# Patient Record
Sex: Female | Born: 1940
Health system: Southern US, Community
[De-identification: ages and names within clinical notes are randomized; demographics above are authoritative.]

## PROBLEM LIST (undated history)

## (undated) DIAGNOSIS — E559 Vitamin D deficiency, unspecified: Secondary | ICD-10-CM

## (undated) DIAGNOSIS — R32 Unspecified urinary incontinence: Secondary | ICD-10-CM

## (undated) DIAGNOSIS — Z8719 Personal history of other diseases of the digestive system: Secondary | ICD-10-CM

## (undated) DIAGNOSIS — G2581 Restless legs syndrome: Secondary | ICD-10-CM

## (undated) DIAGNOSIS — M1710 Unilateral primary osteoarthritis, unspecified knee: Secondary | ICD-10-CM

## (undated) DIAGNOSIS — Z87898 Personal history of other specified conditions: Secondary | ICD-10-CM

## (undated) DIAGNOSIS — M179 Osteoarthritis of knee, unspecified: Secondary | ICD-10-CM

## (undated) DIAGNOSIS — E785 Hyperlipidemia, unspecified: Secondary | ICD-10-CM

## (undated) DIAGNOSIS — D509 Iron deficiency anemia, unspecified: Secondary | ICD-10-CM

## (undated) DIAGNOSIS — F419 Anxiety disorder, unspecified: Secondary | ICD-10-CM

## (undated) DIAGNOSIS — Z8709 Personal history of other diseases of the respiratory system: Secondary | ICD-10-CM

## (undated) DIAGNOSIS — N189 Chronic kidney disease, unspecified: Secondary | ICD-10-CM

## (undated) DIAGNOSIS — K219 Gastro-esophageal reflux disease without esophagitis: Secondary | ICD-10-CM

## (undated) DIAGNOSIS — E039 Hypothyroidism, unspecified: Secondary | ICD-10-CM

## (undated) DIAGNOSIS — R569 Unspecified convulsions: Secondary | ICD-10-CM

## (undated) DIAGNOSIS — I10 Essential (primary) hypertension: Secondary | ICD-10-CM

## (undated) HISTORY — DX: Chronic kidney disease, unspecified: N18.9

## (undated) HISTORY — PX: TONSILLECTOMY: SUR1361

## (undated) HISTORY — PX: COLONOSCOPY: SHX174

## (undated) HISTORY — PX: TOTAL KNEE ARTHROPLASTY: SHX125

## (undated) HISTORY — PX: BREAST SURGERY: SHX581

## (undated) HISTORY — PX: ABDOMINAL HYSTERECTOMY: SHX81

## (undated) HISTORY — PX: JOINT REPLACEMENT: SHX530

## (undated) HISTORY — PX: KNEE SURGERY: SHX244

## (undated) HISTORY — PX: BREAST BIOPSY: SHX20

---

## 1998-05-23 ENCOUNTER — Other Ambulatory Visit: Admission: RE | Admit: 1998-05-23 | Discharge: 1998-05-23 | Payer: Self-pay | Admitting: Obstetrics and Gynecology

## 1999-05-25 ENCOUNTER — Other Ambulatory Visit: Admission: RE | Admit: 1999-05-25 | Discharge: 1999-05-25 | Payer: Self-pay | Admitting: Obstetrics and Gynecology

## 2000-09-13 ENCOUNTER — Other Ambulatory Visit: Admission: RE | Admit: 2000-09-13 | Discharge: 2000-09-13 | Payer: Self-pay | Admitting: Gynecology

## 2015-10-20 DIAGNOSIS — E785 Hyperlipidemia, unspecified: Secondary | ICD-10-CM | POA: Insufficient documentation

## 2015-10-20 DIAGNOSIS — R002 Palpitations: Secondary | ICD-10-CM | POA: Insufficient documentation

## 2015-10-20 DIAGNOSIS — E039 Hypothyroidism, unspecified: Secondary | ICD-10-CM | POA: Insufficient documentation

## 2015-10-20 DIAGNOSIS — E559 Vitamin D deficiency, unspecified: Secondary | ICD-10-CM | POA: Insufficient documentation

## 2015-10-20 DIAGNOSIS — R12 Heartburn: Secondary | ICD-10-CM | POA: Insufficient documentation

## 2015-10-20 DIAGNOSIS — I10 Essential (primary) hypertension: Secondary | ICD-10-CM | POA: Diagnosis not present

## 2015-10-20 DIAGNOSIS — R413 Other amnesia: Secondary | ICD-10-CM | POA: Insufficient documentation

## 2015-10-20 DIAGNOSIS — M1712 Unilateral primary osteoarthritis, left knee: Secondary | ICD-10-CM | POA: Insufficient documentation

## 2015-10-22 DIAGNOSIS — I10 Essential (primary) hypertension: Secondary | ICD-10-CM | POA: Diagnosis not present

## 2015-10-22 DIAGNOSIS — E785 Hyperlipidemia, unspecified: Secondary | ICD-10-CM | POA: Diagnosis not present

## 2015-11-25 DIAGNOSIS — H2513 Age-related nuclear cataract, bilateral: Secondary | ICD-10-CM | POA: Diagnosis not present

## 2015-11-25 DIAGNOSIS — H02834 Dermatochalasis of left upper eyelid: Secondary | ICD-10-CM | POA: Diagnosis not present

## 2015-11-25 DIAGNOSIS — H02831 Dermatochalasis of right upper eyelid: Secondary | ICD-10-CM | POA: Diagnosis not present

## 2015-11-25 DIAGNOSIS — H539 Unspecified visual disturbance: Secondary | ICD-10-CM | POA: Diagnosis not present

## 2016-02-27 DIAGNOSIS — M5432 Sciatica, left side: Secondary | ICD-10-CM | POA: Diagnosis not present

## 2016-02-27 DIAGNOSIS — G8929 Other chronic pain: Secondary | ICD-10-CM | POA: Diagnosis not present

## 2016-02-27 DIAGNOSIS — M5440 Lumbago with sciatica, unspecified side: Secondary | ICD-10-CM | POA: Diagnosis not present

## 2016-02-27 DIAGNOSIS — M25561 Pain in right knee: Secondary | ICD-10-CM | POA: Diagnosis not present

## 2016-02-27 DIAGNOSIS — M533 Sacrococcygeal disorders, not elsewhere classified: Secondary | ICD-10-CM | POA: Diagnosis not present

## 2016-04-20 DIAGNOSIS — I10 Essential (primary) hypertension: Secondary | ICD-10-CM | POA: Diagnosis not present

## 2016-04-20 DIAGNOSIS — R413 Other amnesia: Secondary | ICD-10-CM | POA: Diagnosis not present

## 2016-04-20 DIAGNOSIS — E039 Hypothyroidism, unspecified: Secondary | ICD-10-CM | POA: Diagnosis not present

## 2016-05-11 DIAGNOSIS — I10 Essential (primary) hypertension: Secondary | ICD-10-CM | POA: Diagnosis not present

## 2016-05-11 DIAGNOSIS — E039 Hypothyroidism, unspecified: Secondary | ICD-10-CM | POA: Diagnosis not present

## 2016-05-12 DIAGNOSIS — Z0000 Encounter for general adult medical examination without abnormal findings: Secondary | ICD-10-CM | POA: Diagnosis not present

## 2016-05-12 DIAGNOSIS — Z136 Encounter for screening for cardiovascular disorders: Secondary | ICD-10-CM | POA: Diagnosis not present

## 2016-05-12 DIAGNOSIS — I499 Cardiac arrhythmia, unspecified: Secondary | ICD-10-CM | POA: Diagnosis not present

## 2016-05-12 DIAGNOSIS — Z23 Encounter for immunization: Secondary | ICD-10-CM | POA: Diagnosis not present

## 2016-05-14 DIAGNOSIS — G40909 Epilepsy, unspecified, not intractable, without status epilepticus: Secondary | ICD-10-CM | POA: Diagnosis not present

## 2016-05-21 DIAGNOSIS — G40909 Epilepsy, unspecified, not intractable, without status epilepticus: Secondary | ICD-10-CM | POA: Diagnosis not present

## 2016-05-25 DIAGNOSIS — I10 Essential (primary) hypertension: Secondary | ICD-10-CM | POA: Diagnosis not present

## 2016-05-26 DIAGNOSIS — I10 Essential (primary) hypertension: Secondary | ICD-10-CM | POA: Diagnosis not present

## 2016-05-26 DIAGNOSIS — G40909 Epilepsy, unspecified, not intractable, without status epilepticus: Secondary | ICD-10-CM | POA: Diagnosis not present

## 2016-05-26 DIAGNOSIS — E875 Hyperkalemia: Secondary | ICD-10-CM | POA: Diagnosis not present

## 2016-05-27 DIAGNOSIS — Z23 Encounter for immunization: Secondary | ICD-10-CM | POA: Diagnosis not present

## 2016-06-01 DIAGNOSIS — Z1212 Encounter for screening for malignant neoplasm of rectum: Secondary | ICD-10-CM | POA: Diagnosis not present

## 2016-06-08 DIAGNOSIS — G40909 Epilepsy, unspecified, not intractable, without status epilepticus: Secondary | ICD-10-CM | POA: Diagnosis not present

## 2016-06-09 DIAGNOSIS — R195 Other fecal abnormalities: Secondary | ICD-10-CM | POA: Diagnosis not present

## 2016-06-11 DIAGNOSIS — Z0000 Encounter for general adult medical examination without abnormal findings: Secondary | ICD-10-CM | POA: Diagnosis not present

## 2016-06-16 DIAGNOSIS — G40909 Epilepsy, unspecified, not intractable, without status epilepticus: Secondary | ICD-10-CM | POA: Diagnosis not present

## 2016-06-29 DIAGNOSIS — G40909 Epilepsy, unspecified, not intractable, without status epilepticus: Secondary | ICD-10-CM | POA: Diagnosis not present

## 2016-07-12 DIAGNOSIS — Z780 Asymptomatic menopausal state: Secondary | ICD-10-CM | POA: Diagnosis not present

## 2016-07-12 DIAGNOSIS — Z1231 Encounter for screening mammogram for malignant neoplasm of breast: Secondary | ICD-10-CM | POA: Diagnosis not present

## 2016-07-12 DIAGNOSIS — M8588 Other specified disorders of bone density and structure, other site: Secondary | ICD-10-CM | POA: Diagnosis not present

## 2016-08-05 DIAGNOSIS — J0140 Acute pansinusitis, unspecified: Secondary | ICD-10-CM | POA: Diagnosis not present

## 2016-08-05 DIAGNOSIS — R030 Elevated blood-pressure reading, without diagnosis of hypertension: Secondary | ICD-10-CM | POA: Diagnosis not present

## 2016-08-10 DIAGNOSIS — G40909 Epilepsy, unspecified, not intractable, without status epilepticus: Secondary | ICD-10-CM | POA: Diagnosis not present

## 2016-08-24 DIAGNOSIS — I10 Essential (primary) hypertension: Secondary | ICD-10-CM | POA: Diagnosis not present

## 2016-08-25 DIAGNOSIS — E039 Hypothyroidism, unspecified: Secondary | ICD-10-CM | POA: Diagnosis not present

## 2016-08-25 DIAGNOSIS — E785 Hyperlipidemia, unspecified: Secondary | ICD-10-CM | POA: Diagnosis not present

## 2016-08-25 DIAGNOSIS — I10 Essential (primary) hypertension: Secondary | ICD-10-CM | POA: Diagnosis not present

## 2016-08-25 DIAGNOSIS — G2581 Restless legs syndrome: Secondary | ICD-10-CM | POA: Insufficient documentation

## 2016-11-05 DIAGNOSIS — K449 Diaphragmatic hernia without obstruction or gangrene: Secondary | ICD-10-CM | POA: Diagnosis not present

## 2016-11-05 DIAGNOSIS — J209 Acute bronchitis, unspecified: Secondary | ICD-10-CM | POA: Diagnosis not present

## 2016-11-05 DIAGNOSIS — R509 Fever, unspecified: Secondary | ICD-10-CM | POA: Diagnosis not present

## 2016-11-23 DIAGNOSIS — I10 Essential (primary) hypertension: Secondary | ICD-10-CM | POA: Diagnosis not present

## 2016-11-24 DIAGNOSIS — E039 Hypothyroidism, unspecified: Secondary | ICD-10-CM | POA: Diagnosis not present

## 2016-11-24 DIAGNOSIS — G2581 Restless legs syndrome: Secondary | ICD-10-CM | POA: Diagnosis not present

## 2016-11-24 DIAGNOSIS — M25561 Pain in right knee: Secondary | ICD-10-CM | POA: Diagnosis not present

## 2016-11-24 DIAGNOSIS — E785 Hyperlipidemia, unspecified: Secondary | ICD-10-CM | POA: Diagnosis not present

## 2016-11-24 DIAGNOSIS — E669 Obesity, unspecified: Secondary | ICD-10-CM | POA: Diagnosis not present

## 2016-11-24 DIAGNOSIS — I10 Essential (primary) hypertension: Secondary | ICD-10-CM | POA: Diagnosis not present

## 2016-11-24 DIAGNOSIS — Z6835 Body mass index (BMI) 35.0-35.9, adult: Secondary | ICD-10-CM | POA: Diagnosis not present

## 2017-03-22 DIAGNOSIS — G40909 Epilepsy, unspecified, not intractable, without status epilepticus: Secondary | ICD-10-CM | POA: Diagnosis not present

## 2017-04-12 DIAGNOSIS — R609 Edema, unspecified: Secondary | ICD-10-CM | POA: Diagnosis not present

## 2017-04-12 DIAGNOSIS — I10 Essential (primary) hypertension: Secondary | ICD-10-CM | POA: Diagnosis not present

## 2017-04-13 DIAGNOSIS — Z79899 Other long term (current) drug therapy: Secondary | ICD-10-CM | POA: Diagnosis not present

## 2017-04-13 DIAGNOSIS — R600 Localized edema: Secondary | ICD-10-CM | POA: Diagnosis not present

## 2017-04-13 DIAGNOSIS — D649 Anemia, unspecified: Secondary | ICD-10-CM | POA: Diagnosis not present

## 2017-04-14 DIAGNOSIS — D649 Anemia, unspecified: Secondary | ICD-10-CM | POA: Diagnosis not present

## 2017-05-03 DIAGNOSIS — Z23 Encounter for immunization: Secondary | ICD-10-CM | POA: Diagnosis not present

## 2017-06-16 DIAGNOSIS — R600 Localized edema: Secondary | ICD-10-CM | POA: Diagnosis not present

## 2017-06-16 DIAGNOSIS — E782 Mixed hyperlipidemia: Secondary | ICD-10-CM | POA: Diagnosis not present

## 2017-06-16 DIAGNOSIS — G2581 Restless legs syndrome: Secondary | ICD-10-CM | POA: Diagnosis not present

## 2017-06-16 DIAGNOSIS — K219 Gastro-esophageal reflux disease without esophagitis: Secondary | ICD-10-CM | POA: Diagnosis not present

## 2017-06-16 DIAGNOSIS — G40909 Epilepsy, unspecified, not intractable, without status epilepticus: Secondary | ICD-10-CM | POA: Diagnosis not present

## 2017-06-16 DIAGNOSIS — I10 Essential (primary) hypertension: Secondary | ICD-10-CM | POA: Diagnosis not present

## 2017-06-16 DIAGNOSIS — M170 Bilateral primary osteoarthritis of knee: Secondary | ICD-10-CM | POA: Diagnosis not present

## 2017-06-16 DIAGNOSIS — D509 Iron deficiency anemia, unspecified: Secondary | ICD-10-CM | POA: Diagnosis not present

## 2017-06-16 DIAGNOSIS — E039 Hypothyroidism, unspecified: Secondary | ICD-10-CM | POA: Diagnosis not present

## 2017-08-09 DIAGNOSIS — H40013 Open angle with borderline findings, low risk, bilateral: Secondary | ICD-10-CM | POA: Diagnosis not present

## 2017-08-09 DIAGNOSIS — H2513 Age-related nuclear cataract, bilateral: Secondary | ICD-10-CM | POA: Diagnosis not present

## 2017-09-16 DIAGNOSIS — K219 Gastro-esophageal reflux disease without esophagitis: Secondary | ICD-10-CM | POA: Diagnosis not present

## 2017-09-16 DIAGNOSIS — I10 Essential (primary) hypertension: Secondary | ICD-10-CM | POA: Diagnosis not present

## 2017-09-16 DIAGNOSIS — E782 Mixed hyperlipidemia: Secondary | ICD-10-CM | POA: Diagnosis not present

## 2017-09-16 DIAGNOSIS — N183 Chronic kidney disease, stage 3 (moderate): Secondary | ICD-10-CM | POA: Diagnosis not present

## 2017-09-16 DIAGNOSIS — M170 Bilateral primary osteoarthritis of knee: Secondary | ICD-10-CM | POA: Diagnosis not present

## 2017-09-16 DIAGNOSIS — Z23 Encounter for immunization: Secondary | ICD-10-CM | POA: Diagnosis not present

## 2017-09-16 DIAGNOSIS — R600 Localized edema: Secondary | ICD-10-CM | POA: Diagnosis not present

## 2017-09-16 DIAGNOSIS — Z0000 Encounter for general adult medical examination without abnormal findings: Secondary | ICD-10-CM | POA: Diagnosis not present

## 2017-09-16 DIAGNOSIS — G2581 Restless legs syndrome: Secondary | ICD-10-CM | POA: Diagnosis not present

## 2017-09-16 DIAGNOSIS — E039 Hypothyroidism, unspecified: Secondary | ICD-10-CM | POA: Diagnosis not present

## 2017-09-16 DIAGNOSIS — G40909 Epilepsy, unspecified, not intractable, without status epilepticus: Secondary | ICD-10-CM | POA: Diagnosis not present

## 2017-09-17 ENCOUNTER — Other Ambulatory Visit: Payer: Self-pay | Admitting: Family Medicine

## 2017-09-17 DIAGNOSIS — E2839 Other primary ovarian failure: Secondary | ICD-10-CM

## 2017-09-17 DIAGNOSIS — Z1231 Encounter for screening mammogram for malignant neoplasm of breast: Secondary | ICD-10-CM

## 2017-09-21 DIAGNOSIS — M1711 Unilateral primary osteoarthritis, right knee: Secondary | ICD-10-CM | POA: Diagnosis not present

## 2017-09-21 DIAGNOSIS — M25561 Pain in right knee: Secondary | ICD-10-CM | POA: Diagnosis not present

## 2017-10-07 ENCOUNTER — Other Ambulatory Visit: Payer: Self-pay

## 2017-10-07 ENCOUNTER — Ambulatory Visit: Payer: Self-pay

## 2017-10-26 DIAGNOSIS — M1711 Unilateral primary osteoarthritis, right knee: Secondary | ICD-10-CM | POA: Diagnosis not present

## 2017-10-26 DIAGNOSIS — M25561 Pain in right knee: Secondary | ICD-10-CM | POA: Diagnosis not present

## 2017-10-27 ENCOUNTER — Other Ambulatory Visit: Payer: Self-pay

## 2017-10-27 ENCOUNTER — Ambulatory Visit: Payer: Self-pay

## 2017-11-14 ENCOUNTER — Ambulatory Visit
Admission: RE | Admit: 2017-11-14 | Discharge: 2017-11-14 | Disposition: A | Discharge status: Home / Self Care | Payer: Medicare Other | Source: Ambulatory Visit | Attending: Family Medicine | Admitting: Family Medicine

## 2017-11-14 DIAGNOSIS — E2839 Other primary ovarian failure: Secondary | ICD-10-CM

## 2017-11-14 DIAGNOSIS — Z780 Asymptomatic menopausal state: Secondary | ICD-10-CM | POA: Diagnosis not present

## 2017-11-14 DIAGNOSIS — Z1231 Encounter for screening mammogram for malignant neoplasm of breast: Secondary | ICD-10-CM

## 2017-11-14 DIAGNOSIS — M8589 Other specified disorders of bone density and structure, multiple sites: Secondary | ICD-10-CM | POA: Diagnosis not present

## 2017-11-16 DIAGNOSIS — M810 Age-related osteoporosis without current pathological fracture: Secondary | ICD-10-CM | POA: Diagnosis not present

## 2017-11-18 ENCOUNTER — Encounter (HOSPITAL_COMMUNITY): Payer: Self-pay

## 2017-11-18 DIAGNOSIS — M1711 Unilateral primary osteoarthritis, right knee: Secondary | ICD-10-CM | POA: Diagnosis not present

## 2017-11-18 NOTE — Pre-Procedure Instructions (Signed)
Surgical clearance is in patient's hard chart.

## 2017-11-18 NOTE — H&P (Signed)
TOTAL KNEE ADMISSION H&P  Patient is being admitted for right total knee arthroplasty.  Subjective:  Chief Complaint:  Right knee primary OA / pain  HPI: Robin Horton, 77 y.o. female, has a history of pain and functional disability in the right knee due to arthritis and has failed non-surgical conservative treatments for greater than 12 weeks to include NSAID's and/or analgesics, use of assistive devices and activity modification.  Onset of symptoms was gradual, starting ~3 years ago with gradually worsening course since that time. The patient noted prior procedures on the knee to include  arthroplasty on the left knee in about 1985 in [facility].  Patient currently rates pain in the right knee(s) at 9 out of 10 with activity. Patient has night pain, worsening of pain with activity and weight bearing, pain that interferes with activities of daily living, pain with passive range of motion, crepitus and joint swelling.  Patient has evidence of periarticular osteophytes and joint space narrowing by imaging studies.  There is no active infection.  Risks, benefits and expectations were discussed with the patient.  Risks including but not limited to the risk of anesthesia, blood clots, nerve damage, blood vessel damage, failure of the prosthesis, infection and up to and including death.  Patient understand the risks, benefits and expectations and wishes to proceed with surgery.   PCP: Patient, No Pcp Per  D/C Plans:       Home   Post-op Meds:       No Rx given  Tranexamic Acid:      To be given - IV   Decadron:      Is to be given  FYI:      ASA  Norco  DME:   Rx given for - RW   PT:    OPPT Rx given    Past Medical History:  Diagnosis Date  . Dyslipidemia   . GERD (gastroesophageal reflux disease)   . History of bronchitis   . History of palpitations   . History of short term memory loss   . Hypertension   . Hypothyroidism   . OA (osteoarthritis) of knee    Left  . Restless legs  syndrome (RLS)   . Seizures (HCC)    Myoclonic epilepsy  . Vitamin D deficiency     Past Surgical History:  Procedure Laterality Date  . ABDOMINAL HYSTERECTOMY    . BREAST SURGERY    . JOINT REPLACEMENT Left    Knee  . KNEE SURGERY    . TONSILLECTOMY      No current facility-administered medications for this encounter.    Current Outpatient Medications  Medication Sig Dispense Refill Last Dose  . amLODipine (NORVASC) 5 MG tablet Take 5 mg by mouth daily.     Marland Kitchen atorvastatin (LIPITOR) 40 MG tablet Take 40 mg by mouth daily.     . ferrous sulfate 325 (65 FE) MG EC tablet Take 325 mg by mouth daily.     . furosemide (LASIX) 40 MG tablet Take 40 mg by mouth daily.     Marland Kitchen levETIRAcetam (KEPPRA) 500 MG tablet Take 500 mg by mouth 2 (two) times daily.     Marland Kitchen levothyroxine (SYNTHROID, LEVOTHROID) 50 MCG tablet Take 50 mcg by mouth daily before breakfast.     . omeprazole (PRILOSEC OTC) 20 MG tablet Take 20-40 mg by mouth daily as needed (acid reflux).      Marland Kitchen rOPINIRole (REQUIP) 0.5 MG tablet Take 0.5 mg by mouth 2 (  two) times daily.     Marland Kitchen. aspirin EC 81 MG tablet Take 81 mg by mouth daily.     . Cholecalciferol (VITAMIN D) 2000 units tablet Take 2,000 Units by mouth daily.      No Known Allergies   Social History   Tobacco Use  . Smoking status: Former Games developermoker  . Smokeless tobacco: Never Used  Substance Use Topics  . Alcohol use: No    Frequency: Never       Review of Systems  Constitutional: Negative.   HENT: Positive for hearing loss and tinnitus.   Eyes: Negative.   Respiratory: Negative.   Cardiovascular: Negative.   Gastrointestinal: Positive for heartburn.  Genitourinary: Positive for frequency.  Musculoskeletal: Positive for joint pain.  Skin: Negative.   Neurological: Negative.   Endo/Heme/Allergies: Negative.   Psychiatric/Behavioral: Negative.     Objective:  Physical Exam  Constitutional: She is oriented to person, place, and time. She appears well-developed.   HENT:  Head: Normocephalic.  Eyes: Pupils are equal, round, and reactive to light.  Neck: Neck supple. No JVD present. No tracheal deviation present. No thyromegaly present.  Cardiovascular: Normal rate, regular rhythm and intact distal pulses.  Respiratory: Effort normal and breath sounds normal. No respiratory distress. She has no wheezes.  GI: Soft. There is no tenderness. There is no guarding.  Musculoskeletal:       Right knee: She exhibits decreased range of motion, swelling and bony tenderness. She exhibits no ecchymosis, no deformity, no laceration and no erythema. Tenderness found.  Lymphadenopathy:    She has no cervical adenopathy.  Neurological: She is alert and oriented to person, place, and time.  Skin: Skin is warm and dry.  Psychiatric: She has a normal mood and affect.      Imaging Review Plain radiographs demonstrate severe degenerative joint disease of the right knee. The bone quality appears to be good for age and reported activity level.  Assessment/Plan:  End stage arthritis, right knee   The patient history, physical examination, clinical judgment of the provider and imaging studies are consistent with end stage degenerative joint disease of the right knee and total knee arthroplasty is deemed medically necessary. The treatment options including medical management, injection therapy arthroscopy and arthroplasty were discussed at length. The risks and benefits of total knee arthroplasty were presented and reviewed. The risks due to aseptic loosening, infection, stiffness, patella tracking problems, thromboembolic complications and other imponderables were discussed. The patient acknowledged the explanation, agreed to proceed with the plan and consent was signed. Patient is being admitted for inpatient treatment for surgery, pain control, PT, OT, prophylactic antibiotics, VTE prophylaxis, progressive ambulation and ADL's and discharge planning. The patient is planning  to be discharged home.     Anastasio AuerbachMatthew S. Jolly Carlini   PA-C  11/18/2017, 10:42 AM

## 2017-11-18 NOTE — Patient Instructions (Addendum)
Your procedure is scheduled on: Monday, November 28, 2017   Surgery Time:  12PM-1:30PM   [facility]    Report to admitting at 9:30 AM   Call this number if you have problems the morning of surgery [phone]   Do not eat food or drink liquids :After Midnight.   Do NOT smoke after Midnight   Take these medicines the morning of surgery with A SIP OF WATER: Amlodipine, Atorvastatin, Levetiracetam, Levothyroxine, Omeprazole, Requip              You may not have any metal on your body including hair pins, jewelry, and body piercings             Do not wear make-up, lotions, powders, perfumes/cologne, or deodorant             Do not wear nail polish.  Do not shave  48 hours prior to surgery.       Do not bring valuables to the hospital. [facility] IS NOT             RESPONSIBLE   FOR VALUABLES.   Contacts, dentures or bridgework may not be worn into surgery.   Leave suitcase in the car. After surgery it may be brought to your room.   Special Instructions: Bring a copy of your healthcare power of attorney and living will documents         the day of surgery if you haven't scanned them in before.              Please read over the following fact sheets you were given:  [facility] - Preparing for Surgery Before surgery, you can play an important role.  Because skin is not sterile, your skin needs to be as free of germs as possible.  You can reduce the number of germs on your skin by washing with CHG (chlorahexidine gluconate) soap before surgery.  CHG is an antiseptic cleaner which kills germs and bonds with the skin to continue killing germs even after washing. Please DO NOT use if you have an allergy to CHG or antibacterial soaps.  If your skin becomes reddened/irritated stop using the CHG and inform your nurse when you arrive at Short Stay. Do not shave (including legs and underarms) for at least 48 hours prior to the first CHG shower.  You may  shave your face/neck.  Please follow these instructions carefully:  1.  Shower with CHG Soap the night before surgery and the  morning of surgery.  2.  If you choose to wash your hair, wash your hair first as usual with your normal  shampoo.  3.  After you shampoo, rinse your hair and body thoroughly to remove the shampoo.                             4.  Use CHG as you would any other liquid soap.  You can apply chg directly to the skin and wash.  Gently with a scrungie or clean washcloth.  5.  Apply the CHG Soap to your body ONLY FROM THE NECK DOWN.   Do   not use on face/ open                           Wound or open sores. Avoid contact with eyes, ears mouth and  genitals (private parts).                       Wash face,  Genitals (private parts) with your normal soap.             6.  Wash thoroughly, paying special attention to the area where your    surgery  will be performed.  7.  Thoroughly rinse your body with warm water from the neck down.  8.  DO NOT shower/wash with your normal soap after using and rinsing off the CHG Soap.                9.  Pat yourself dry with a clean towel.            10.  Wear clean pajamas.            11.  Place clean sheets on your bed the night of your first shower and do not  sleep with pets. Day of Surgery : Do not apply any lotions/deodorants the morning of surgery.  Please wear clean clothes to the hospital/surgery center.  FAILURE TO FOLLOW THESE INSTRUCTIONS MAY RESULT IN THE CANCELLATION OF YOUR SURGERY  PATIENT SIGNATURE_________________________________  NURSE SIGNATURE__________________________________  ________________________________________________________________________   Adam Phenix  An incentive spirometer is a tool that can help keep your lungs clear and active. This tool measures how well you are filling your lungs with each breath. Taking long deep breaths may help reverse or decrease the chance of developing breathing  (pulmonary) problems (especially infection) following:  A long period of time when you are unable to move or be active. BEFORE THE PROCEDURE   If the spirometer includes an indicator to show your best effort, your nurse or respiratory therapist will set it to a desired goal.  If possible, sit up straight or lean slightly forward. Try not to slouch.  Hold the incentive spirometer in an upright position. INSTRUCTIONS FOR USE  1. Sit on the edge of your bed if possible, or sit up as far as you can in bed or on a chair. 2. Hold the incentive spirometer in an upright position. 3. Breathe out normally. 4. Place the mouthpiece in your mouth and seal your lips tightly around it. 5. Breathe in slowly and as deeply as possible, raising the piston or the ball toward the top of the column. 6. Hold your breath for 3-5 seconds or for as long as possible. Allow the piston or ball to fall to the bottom of the column. 7. Remove the mouthpiece from your mouth and breathe out normally. 8. Rest for a few seconds and repeat Steps 1 through 7 at least 10 times every 1-2 hours when you are awake. Take your time and take a few normal breaths between deep breaths. 9. The spirometer may include an indicator to show your best effort. Use the indicator as a goal to work toward during each repetition. 10. After each set of 10 deep breaths, practice coughing to be sure your lungs are clear. If you have an incision (the cut made at the time of surgery), support your incision when coughing by placing a pillow or rolled up towels firmly against it. Once you are able to get out of bed, walk around indoors and cough well. You may stop using the incentive spirometer when instructed by your caregiver.  RISKS AND COMPLICATIONS  Take your time so you do not get dizzy or light-headed.  If you are in  pain, you may need to take or ask for pain medication before doing incentive spirometry. It is harder to take a deep breath if you  are having pain. AFTER USE  Rest and breathe slowly and easily.  It can be helpful to keep track of a log of your progress. Your caregiver can provide you with a simple table to help with this. If you are using the spirometer at home, follow these instructions: [place] IF:   You are having difficultly using the spirometer.  You have trouble using the spirometer as often as instructed.  Your pain medication is not giving enough relief while using the spirometer.  You develop fever of 100.5 F (38.1 C) or higher. SEEK IMMEDIATE MEDICAL CARE IF:   You cough up bloody sputum that had not been present before.  You develop fever of 102 F (38.9 C) or greater.  You develop worsening pain at or near the incision site. MAKE SURE YOU:   Understand these instructions.  Will watch your condition.  Will get help right away if you are not doing well or get worse. Document Released: 01/03/2007 Document Revised: 11/15/2011 Document Reviewed: 03/06/2007 ExitCare Patient Information 2014 Exit[employer], [place].   ________________________________________________________________________  WHAT IS A BLOOD TRANSFUSION? Blood Transfusion Information  A transfusion is the replacement of blood or some of its parts. Blood is made up of multiple cells which provide different functions.  Red blood cells carry oxygen and are used for blood loss replacement.  White blood cells fight against infection.  Platelets control bleeding.  Plasma helps clot blood.  Other blood products are available for specialized needs, such as hemophilia or other clotting disorders. BEFORE THE TRANSFUSION  Who gives blood for transfusions?   Healthy volunteers who are fully evaluated to make sure their blood is safe. This is blood bank blood. Transfusion therapy is the safest it has ever been in the practice of medicine. Before blood is taken from a donor, a complete history is taken to make sure that person has  no history of diseases nor engages in risky social behavior (examples are intravenous drug use or sexual activity with multiple partners). The donor's travel history is screened to minimize risk of transmitting infections, such as malaria. The donated blood is tested for signs of infectious diseases, such as HIV and hepatitis. The blood is then tested to be sure it is compatible with you in order to minimize the chance of a transfusion reaction. If you or a relative donates blood, this is often done in anticipation of surgery and is not appropriate for emergency situations. It takes many days to process the donated blood. RISKS AND COMPLICATIONS Although transfusion therapy is very safe and saves many lives, the main dangers of transfusion include:   Getting an infectious disease.  Developing a transfusion reaction. This is an allergic reaction to something in the blood you were given. Every precaution is taken to prevent this. The decision to have a blood transfusion has been considered carefully by your caregiver before blood is given. Blood is not given unless the benefits outweigh the risks. AFTER THE TRANSFUSION  Right after receiving a blood transfusion, you will usually feel much better and more energetic. This is especially true if your red blood cells have gotten low (anemic). The transfusion raises the level of the red blood cells which carry oxygen, and this usually causes an energy increase.  The nurse administering the transfusion will monitor you carefully for complications. HOME CARE INSTRUCTIONS  No special instructions are needed after a transfusion. You may find your energy is better. Speak with your caregiver about any limitations on activity for underlying diseases you may have. SEEK MEDICAL CARE IF:   Your condition is not improving after your transfusion.  You develop redness or irritation at the intravenous (IV) site. SEEK IMMEDIATE MEDICAL CARE IF:  Any of the following  symptoms occur over the next 12 hours:  Shaking chills.  You have a temperature by mouth above 102 F (38.9 C), not controlled by medicine.  Chest, back, or muscle pain.  People around you feel you are not acting correctly or are confused.  Shortness of breath or difficulty breathing.  Dizziness and fainting.  You get a rash or develop hives.  You have a decrease in urine output.  Your urine turns a dark color or changes to pink, red, or brown. Any of the following symptoms occur over the next 10 days:  You have a temperature by mouth above 102 F (38.9 C), not controlled by medicine.  Shortness of breath.  Weakness after normal activity.  The white part of the eye turns yellow (jaundice).  You have a decrease in the amount of urine or are urinating less often.  Your urine turns a dark color or changes to pink, red, or brown. Document Released: 08/20/2000 Document Revised: 11/15/2011 Document Reviewed: 04/08/2008 [facility] Patient Information 2014 [place], [place].  _______________________________________________________________________

## 2017-11-21 ENCOUNTER — Encounter (HOSPITAL_COMMUNITY): Payer: Self-pay

## 2017-11-21 ENCOUNTER — Encounter (HOSPITAL_COMMUNITY)
Admission: RE | Admit: 2017-11-21 | Discharge: 2017-11-21 | Disposition: A | Discharge status: Home / Self Care | Payer: Medicare Other | Source: Ambulatory Visit | Attending: Orthopedic Surgery | Admitting: Orthopedic Surgery

## 2017-11-21 ENCOUNTER — Other Ambulatory Visit: Payer: Self-pay

## 2017-11-21 DIAGNOSIS — Z01818 Encounter for other preprocedural examination: Secondary | ICD-10-CM | POA: Diagnosis not present

## 2017-11-21 DIAGNOSIS — R9431 Abnormal electrocardiogram [ECG] [EKG]: Secondary | ICD-10-CM | POA: Diagnosis not present

## 2017-11-21 HISTORY — DX: Osteoarthritis of knee, unspecified: M17.9

## 2017-11-21 HISTORY — DX: Personal history of other specified conditions: Z87.898

## 2017-11-21 HISTORY — DX: Hyperlipidemia, unspecified: E78.5

## 2017-11-21 HISTORY — DX: Personal history of other diseases of the digestive system: Z87.19

## 2017-11-21 HISTORY — DX: Unspecified convulsions: R56.9

## 2017-11-21 HISTORY — DX: Essential (primary) hypertension: I10

## 2017-11-21 HISTORY — DX: Personal history of other diseases of the respiratory system: Z87.09

## 2017-11-21 HISTORY — DX: Unilateral primary osteoarthritis, unspecified knee: M17.10

## 2017-11-21 HISTORY — DX: Unspecified urinary incontinence: R32

## 2017-11-21 HISTORY — DX: Restless legs syndrome: G25.81

## 2017-11-21 HISTORY — DX: Anxiety disorder, unspecified: F41.9

## 2017-11-21 HISTORY — DX: Vitamin D deficiency, unspecified: E55.9

## 2017-11-21 HISTORY — DX: Iron deficiency anemia, unspecified: D50.9

## 2017-11-21 HISTORY — DX: Hypothyroidism, unspecified: E03.9

## 2017-11-21 HISTORY — DX: Gastro-esophageal reflux disease without esophagitis: K21.9

## 2017-11-21 LAB — CBC
HEMATOCRIT: 40.3 % (ref 36.0–46.0)
HEMOGLOBIN: 13.4 g/dL (ref 12.0–15.0)
MCH: 31.8 pg (ref 26.0–34.0)
MCHC: 33.3 g/dL (ref 30.0–36.0)
MCV: 95.7 fL (ref 78.0–100.0)
Platelets: 255 10*3/uL (ref 150–400)
RBC: 4.21 MIL/uL (ref 3.87–5.11)
RDW: 12.8 % (ref 11.5–15.5)
WBC: 7.9 10*3/uL (ref 4.0–10.5)

## 2017-11-21 LAB — ABO/RH: ABO/RH(D): A POS

## 2017-11-21 LAB — BASIC METABOLIC PANEL
Anion gap: 11 (ref 5–15)
BUN: 24 mg/dL — ABNORMAL HIGH (ref 6–20)
CHLORIDE: 103 mmol/L (ref 101–111)
CO2: 27 mmol/L (ref 22–32)
Calcium: 9.7 mg/dL (ref 8.9–10.3)
Creatinine, Ser: 1.2 mg/dL — ABNORMAL HIGH (ref 0.44–1.00)
GFR calc Af Amer: 50 mL/min — ABNORMAL LOW (ref >60)
GFR, EST NON AFRICAN AMERICAN: 43 mL/min — AB (ref >60)
GLUCOSE: 101 mg/dL — AB (ref 65–99)
POTASSIUM: 4.7 mmol/L (ref 3.5–5.1)
Sodium: 141 mmol/L (ref 135–145)

## 2017-11-21 LAB — SURGICAL PCR SCREEN
MRSA, PCR: NEGATIVE
Staphylococcus aureus: NEGATIVE

## 2017-11-21 NOTE — Pre-Procedure Instructions (Signed)
BMP results 11/21/2017 faxed to Dr. Charlann Boxerlin via epic.

## 2017-11-23 NOTE — Pre-Procedure Instructions (Signed)
Dr. Sampson GoonFitzgerald made aware of BUN 24 no new orders received at this time.

## 2017-11-28 ENCOUNTER — Inpatient Hospital Stay (HOSPITAL_COMMUNITY)
Admission: RE | Admit: 2017-11-28 | Discharge: 2017-11-30 | DRG: 470 | Disposition: A | Discharge status: Home / Self Care | Payer: Medicare Other | Source: Ambulatory Visit | Attending: Orthopedic Surgery | Admitting: Orthopedic Surgery

## 2017-11-28 ENCOUNTER — Encounter (HOSPITAL_COMMUNITY): Payer: Self-pay | Admitting: *Deleted

## 2017-11-28 ENCOUNTER — Other Ambulatory Visit: Payer: Self-pay

## 2017-11-28 ENCOUNTER — Encounter (HOSPITAL_COMMUNITY)
Admission: RE | Disposition: A | Discharge status: Home / Self Care | Payer: Self-pay | Source: Ambulatory Visit | Attending: Orthopedic Surgery

## 2017-11-28 ENCOUNTER — Inpatient Hospital Stay (HOSPITAL_COMMUNITY): Payer: Medicare Other | Admitting: Anesthesiology

## 2017-11-28 DIAGNOSIS — E559 Vitamin D deficiency, unspecified: Secondary | ICD-10-CM | POA: Diagnosis present

## 2017-11-28 DIAGNOSIS — I10 Essential (primary) hypertension: Secondary | ICD-10-CM | POA: Diagnosis not present

## 2017-11-28 DIAGNOSIS — D509 Iron deficiency anemia, unspecified: Secondary | ICD-10-CM | POA: Diagnosis present

## 2017-11-28 DIAGNOSIS — Z79890 Hormone replacement therapy (postmenopausal): Secondary | ICD-10-CM | POA: Diagnosis not present

## 2017-11-28 DIAGNOSIS — Z96659 Presence of unspecified artificial knee joint: Secondary | ICD-10-CM

## 2017-11-28 DIAGNOSIS — K219 Gastro-esophageal reflux disease without esophagitis: Secondary | ICD-10-CM | POA: Diagnosis not present

## 2017-11-28 DIAGNOSIS — G8918 Other acute postprocedural pain: Secondary | ICD-10-CM | POA: Diagnosis not present

## 2017-11-28 DIAGNOSIS — Z96652 Presence of left artificial knee joint: Secondary | ICD-10-CM | POA: Diagnosis present

## 2017-11-28 DIAGNOSIS — Z79899 Other long term (current) drug therapy: Secondary | ICD-10-CM

## 2017-11-28 DIAGNOSIS — E785 Hyperlipidemia, unspecified: Secondary | ICD-10-CM | POA: Diagnosis present

## 2017-11-28 DIAGNOSIS — G40409 Other generalized epilepsy and epileptic syndromes, not intractable, without status epilepticus: Secondary | ICD-10-CM | POA: Diagnosis present

## 2017-11-28 DIAGNOSIS — F419 Anxiety disorder, unspecified: Secondary | ICD-10-CM | POA: Diagnosis present

## 2017-11-28 DIAGNOSIS — Z6837 Body mass index (BMI) 37.0-37.9, adult: Secondary | ICD-10-CM

## 2017-11-28 DIAGNOSIS — E039 Hypothyroidism, unspecified: Secondary | ICD-10-CM | POA: Diagnosis present

## 2017-11-28 DIAGNOSIS — K449 Diaphragmatic hernia without obstruction or gangrene: Secondary | ICD-10-CM | POA: Diagnosis present

## 2017-11-28 DIAGNOSIS — G2581 Restless legs syndrome: Secondary | ICD-10-CM | POA: Diagnosis present

## 2017-11-28 DIAGNOSIS — E669 Obesity, unspecified: Secondary | ICD-10-CM | POA: Diagnosis present

## 2017-11-28 DIAGNOSIS — Z7982 Long term (current) use of aspirin: Secondary | ICD-10-CM

## 2017-11-28 DIAGNOSIS — Z96651 Presence of right artificial knee joint: Secondary | ICD-10-CM

## 2017-11-28 DIAGNOSIS — M1711 Unilateral primary osteoarthritis, right knee: Principal | ICD-10-CM | POA: Diagnosis present

## 2017-11-28 HISTORY — PX: TOTAL KNEE ARTHROPLASTY: SHX125

## 2017-11-28 LAB — TYPE AND SCREEN
ABO/RH(D): A POS
Antibody Screen: NEGATIVE

## 2017-11-28 SURGERY — ARTHROPLASTY, KNEE, TOTAL
Anesthesia: Spinal | Site: Knee | Laterality: Right

## 2017-11-28 MED ORDER — MAGNESIUM CITRATE PO SOLN: 1.0000 | Freq: Once | ORAL | Status: DC | PRN

## 2017-11-28 MED ORDER — LEVETIRACETAM 500 MG PO TABS
500.0000 mg | ORAL_TABLET | Freq: Two times a day (BID) | ORAL | Status: DC
  Administered 2017-11-28 – 2017-11-30 (×4): 500 mg via ORAL
  Filled 2017-11-28 (×4): qty 1

## 2017-11-28 MED ORDER — KETOROLAC TROMETHAMINE 30 MG/ML IJ SOLN
INTRAMUSCULAR | Status: AC
  Filled 2017-11-28: qty 1

## 2017-11-28 MED ORDER — DEXAMETHASONE SODIUM PHOSPHATE 10 MG/ML IJ SOLN
10.0000 mg | Freq: Once | INTRAMUSCULAR | Status: AC
  Administered 2017-11-29: 10 mg via INTRAVENOUS
  Filled 2017-11-28: qty 1

## 2017-11-28 MED ORDER — ACETAMINOPHEN 325 MG PO TABS: 325.0000 mg | ORAL_TABLET | Freq: Four times a day (QID) | ORAL | Status: DC | PRN

## 2017-11-28 MED ORDER — SODIUM CHLORIDE 0.9 % IJ SOLN
INTRAMUSCULAR | Status: DC | PRN
  Administered 2017-11-28: 29 mL

## 2017-11-28 MED ORDER — BISACODYL 10 MG RE SUPP: 10.0000 mg | Freq: Every day | RECTAL | Status: DC | PRN

## 2017-11-28 MED ORDER — METHOCARBAMOL 500 MG PO TABS
500.0000 mg | ORAL_TABLET | Freq: Four times a day (QID) | ORAL | Status: DC | PRN
  Administered 2017-11-29: 500 mg via ORAL
  Filled 2017-11-28: qty 1

## 2017-11-28 MED ORDER — METOCLOPRAMIDE HCL 5 MG/ML IJ SOLN: 5.0000 mg | Freq: Three times a day (TID) | INTRAMUSCULAR | Status: DC | PRN

## 2017-11-28 MED ORDER — MENTHOL 3 MG MT LOZG: 1.0000 | LOZENGE | OROMUCOSAL | Status: DC | PRN

## 2017-11-28 MED ORDER — METHOCARBAMOL 500 MG PO TABS: 500.0000 mg | ORAL_TABLET | Freq: Four times a day (QID) | ORAL | 0 refills | Status: DC | PRN

## 2017-11-28 MED ORDER — LEVOTHYROXINE SODIUM 50 MCG PO TABS
50.0000 ug | ORAL_TABLET | Freq: Every day | ORAL | Status: DC
  Administered 2017-11-29 – 2017-11-30 (×2): 50 ug via ORAL
  Filled 2017-11-28 (×2): qty 1

## 2017-11-28 MED ORDER — FUROSEMIDE 40 MG PO TABS
40.0000 mg | ORAL_TABLET | Freq: Every day | ORAL | Status: DC
  Administered 2017-11-28 – 2017-11-30 (×3): 40 mg via ORAL
  Filled 2017-11-28 (×3): qty 1

## 2017-11-28 MED ORDER — PANTOPRAZOLE SODIUM 40 MG PO TBEC
40.0000 mg | DELAYED_RELEASE_TABLET | Freq: Every day | ORAL | Status: DC | PRN
  Administered 2017-11-30: 06:00:00 40 mg via ORAL
  Filled 2017-11-28: qty 1

## 2017-11-28 MED ORDER — LACTATED RINGERS IV SOLN
INTRAVENOUS | Status: DC
  Administered 2017-11-28 (×2): via INTRAVENOUS

## 2017-11-28 MED ORDER — HYDROCODONE-ACETAMINOPHEN 7.5-325 MG PO TABS
1.0000 | ORAL_TABLET | ORAL | Status: DC | PRN
  Administered 2017-11-29 – 2017-11-30 (×2): 1 via ORAL
  Filled 2017-11-28 (×2): qty 1

## 2017-11-28 MED ORDER — METHOCARBAMOL 1000 MG/10ML IJ SOLN
500.0000 mg | Freq: Four times a day (QID) | INTRAVENOUS | Status: DC | PRN
  Administered 2017-11-28: 17:00:00 500 mg via INTRAVENOUS
  Filled 2017-11-28: qty 550

## 2017-11-28 MED ORDER — AMLODIPINE BESYLATE 5 MG PO TABS
5.0000 mg | ORAL_TABLET | Freq: Every day | ORAL | Status: DC
  Administered 2017-11-29 – 2017-11-30 (×2): 5 mg via ORAL
  Filled 2017-11-28 (×2): qty 1

## 2017-11-28 MED ORDER — OMEPRAZOLE MAGNESIUM 20 MG PO TBEC: 20.0000 mg | DELAYED_RELEASE_TABLET | Freq: Every day | ORAL | Status: DC | PRN

## 2017-11-28 MED ORDER — CEFAZOLIN SODIUM-DEXTROSE 2-4 GM/100ML-% IV SOLN
2.0000 g | Freq: Four times a day (QID) | INTRAVENOUS | Status: AC
  Administered 2017-11-28 (×2): 2 g via INTRAVENOUS
  Filled 2017-11-28 (×2): qty 100

## 2017-11-28 MED ORDER — HYDROCODONE-ACETAMINOPHEN 5-325 MG PO TABS
1.0000 | ORAL_TABLET | ORAL | Status: DC | PRN
  Administered 2017-11-28 – 2017-11-29 (×3): 1 via ORAL
  Filled 2017-11-28 (×3): qty 1

## 2017-11-28 MED ORDER — ONDANSETRON HCL 4 MG/2ML IJ SOLN
INTRAMUSCULAR | Status: DC | PRN
  Administered 2017-11-28: 4 mg via INTRAVENOUS

## 2017-11-28 MED ORDER — FERROUS SULFATE 325 (65 FE) MG PO TABS
325.0000 mg | ORAL_TABLET | Freq: Three times a day (TID) | ORAL | Status: DC
  Administered 2017-11-28 – 2017-11-30 (×5): 325 mg via ORAL
  Filled 2017-11-28 (×5): qty 1

## 2017-11-28 MED ORDER — DEXAMETHASONE SODIUM PHOSPHATE 10 MG/ML IJ SOLN
INTRAMUSCULAR | Status: AC
  Filled 2017-11-28: qty 3

## 2017-11-28 MED ORDER — BUPIVACAINE HCL (PF) 0.25 % IJ SOLN
INTRAMUSCULAR | Status: DC | PRN
  Administered 2017-11-28: 30 mL

## 2017-11-28 MED ORDER — STERILE WATER FOR IRRIGATION IR SOLN
Status: DC | PRN
  Administered 2017-11-28: 2000 mL

## 2017-11-28 MED ORDER — ASPIRIN 81 MG PO CHEW
81.0000 mg | CHEWABLE_TABLET | Freq: Two times a day (BID) | ORAL | Status: DC
  Administered 2017-11-28 – 2017-11-30 (×4): 81 mg via ORAL
  Filled 2017-11-28 (×5): qty 1

## 2017-11-28 MED ORDER — ONDANSETRON HCL 4 MG/2ML IJ SOLN: 4.0000 mg | Freq: Four times a day (QID) | INTRAMUSCULAR | Status: DC | PRN

## 2017-11-28 MED ORDER — ASPIRIN 81 MG PO CHEW: 81.0000 mg | CHEWABLE_TABLET | Freq: Two times a day (BID) | ORAL | 0 refills | Status: AC

## 2017-11-28 MED ORDER — FERROUS SULFATE 325 (65 FE) MG PO TABS: 325.0000 mg | ORAL_TABLET | Freq: Three times a day (TID) | ORAL | 3 refills | Status: DC

## 2017-11-28 MED ORDER — CHLORHEXIDINE GLUCONATE 4 % EX LIQD: 60.0000 mL | Freq: Once | CUTANEOUS | Status: DC

## 2017-11-28 MED ORDER — SODIUM CHLORIDE 0.9 % IR SOLN
Status: DC | PRN
  Administered 2017-11-28: 1000 mL

## 2017-11-28 MED ORDER — FENTANYL CITRATE (PF) 100 MCG/2ML IJ SOLN
50.0000 ug | INTRAMUSCULAR | Status: DC
Start: 2017-11-28 — End: 2017-11-28
  Administered 2017-11-28: 100 ug via INTRAVENOUS
  Filled 2017-11-28: qty 2

## 2017-11-28 MED ORDER — PHENOL 1.4 % MT LIQD
1.0000 | OROMUCOSAL | Status: DC | PRN
  Filled 2017-11-28: qty 177

## 2017-11-28 MED ORDER — MORPHINE SULFATE (PF) 2 MG/ML IV SOLN: 0.5000 mg | INTRAVENOUS | Status: DC | PRN

## 2017-11-28 MED ORDER — PHENYLEPHRINE 40 MCG/ML (10ML) SYRINGE FOR IV PUSH (FOR BLOOD PRESSURE SUPPORT)
PREFILLED_SYRINGE | INTRAVENOUS | Status: AC
  Filled 2017-11-28: qty 10

## 2017-11-28 MED ORDER — 0.9 % SODIUM CHLORIDE (POUR BTL) OPTIME
TOPICAL | Status: DC | PRN
  Administered 2017-11-28: 1000 mL

## 2017-11-28 MED ORDER — PROPOFOL 10 MG/ML IV BOLUS
INTRAVENOUS | Status: AC
  Filled 2017-11-28: qty 60

## 2017-11-28 MED ORDER — ALUM & MAG HYDROXIDE-SIMETH 200-200-20 MG/5ML PO SUSP
15.0000 mL | ORAL | Status: DC | PRN
  Administered 2017-11-29: 01:00:00 15 mL via ORAL
  Filled 2017-11-28: qty 30

## 2017-11-28 MED ORDER — BUPIVACAINE HCL (PF) 0.75 % IJ SOLN
INTRAMUSCULAR | Status: DC | PRN
  Administered 2017-11-28: 2 mL via INTRATHECAL

## 2017-11-28 MED ORDER — CELECOXIB 200 MG PO CAPS
200.0000 mg | ORAL_CAPSULE | Freq: Two times a day (BID) | ORAL | Status: DC
  Administered 2017-11-28 – 2017-11-30 (×4): 200 mg via ORAL
  Filled 2017-11-28 (×4): qty 1

## 2017-11-28 MED ORDER — SODIUM CHLORIDE 0.9 % IV SOLN
1000.0000 mg | INTRAVENOUS | Status: AC
  Administered 2017-11-28: 1000 mg via INTRAVENOUS
  Filled 2017-11-28: qty 1100

## 2017-11-28 MED ORDER — ONDANSETRON HCL 4 MG/2ML IJ SOLN
INTRAMUSCULAR | Status: AC
  Filled 2017-11-28: qty 6

## 2017-11-28 MED ORDER — ROPINIROLE HCL 0.5 MG PO TABS
0.5000 mg | ORAL_TABLET | Freq: Two times a day (BID) | ORAL | Status: DC
  Administered 2017-11-28 – 2017-11-30 (×4): 0.5 mg via ORAL
  Filled 2017-11-28 (×4): qty 1

## 2017-11-28 MED ORDER — FENTANYL CITRATE (PF) 100 MCG/2ML IJ SOLN
INTRAMUSCULAR | Status: AC
  Filled 2017-11-28: qty 2

## 2017-11-28 MED ORDER — SODIUM CHLORIDE 0.9 % IJ SOLN
INTRAMUSCULAR | Status: AC
  Filled 2017-11-28: qty 50

## 2017-11-28 MED ORDER — TRANEXAMIC ACID 1000 MG/10ML IV SOLN
1000.0000 mg | Freq: Once | INTRAVENOUS | Status: AC
  Administered 2017-11-28: 1000 mg via INTRAVENOUS
  Filled 2017-11-28: qty 1100

## 2017-11-28 MED ORDER — POLYETHYLENE GLYCOL 3350 17 G PO PACK
17.0000 g | PACK | Freq: Two times a day (BID) | ORAL | Status: DC
  Administered 2017-11-28 – 2017-11-30 (×4): 17 g via ORAL
  Filled 2017-11-28 (×4): qty 1

## 2017-11-28 MED ORDER — CEFAZOLIN SODIUM-DEXTROSE 2-4 GM/100ML-% IV SOLN
2.0000 g | INTRAVENOUS | Status: AC
  Administered 2017-11-28: 2 g via INTRAVENOUS
  Filled 2017-11-28: qty 100

## 2017-11-28 MED ORDER — PROPOFOL 10 MG/ML IV BOLUS
INTRAVENOUS | Status: AC
  Filled 2017-11-28: qty 20

## 2017-11-28 MED ORDER — METOCLOPRAMIDE HCL 5 MG PO TABS: 5.0000 mg | ORAL_TABLET | Freq: Three times a day (TID) | ORAL | Status: DC | PRN

## 2017-11-28 MED ORDER — DIPHENHYDRAMINE HCL 12.5 MG/5ML PO ELIX: 12.5000 mg | ORAL_SOLUTION | ORAL | Status: DC | PRN

## 2017-11-28 MED ORDER — BUPIVACAINE HCL (PF) 0.25 % IJ SOLN
INTRAMUSCULAR | Status: AC
  Filled 2017-11-28: qty 30

## 2017-11-28 MED ORDER — HYDROCODONE-ACETAMINOPHEN 7.5-325 MG PO TABS: 1.0000 | ORAL_TABLET | ORAL | 0 refills | Status: DC | PRN

## 2017-11-28 MED ORDER — FENTANYL CITRATE (PF) 100 MCG/2ML IJ SOLN
INTRAMUSCULAR | Status: DC | PRN
  Administered 2017-11-28: 100 ug via INTRAVENOUS

## 2017-11-28 MED ORDER — MEPERIDINE HCL 50 MG/ML IJ SOLN: 6.2500 mg | INTRAMUSCULAR | Status: DC | PRN

## 2017-11-28 MED ORDER — FENTANYL CITRATE (PF) 100 MCG/2ML IJ SOLN: 25.0000 ug | INTRAMUSCULAR | Status: DC | PRN

## 2017-11-28 MED ORDER — DEXAMETHASONE SODIUM PHOSPHATE 10 MG/ML IJ SOLN
10.0000 mg | Freq: Once | INTRAMUSCULAR | Status: AC
  Administered 2017-11-28: 10 mg via INTRAVENOUS

## 2017-11-28 MED ORDER — DOCUSATE SODIUM 100 MG PO CAPS
100.0000 mg | ORAL_CAPSULE | Freq: Two times a day (BID) | ORAL | Status: DC
  Administered 2017-11-28 – 2017-11-30 (×4): 100 mg via ORAL
  Filled 2017-11-28 (×4): qty 1

## 2017-11-28 MED ORDER — POLYETHYLENE GLYCOL 3350 17 G PO PACK: 17.0000 g | PACK | Freq: Two times a day (BID) | ORAL | 0 refills | Status: DC

## 2017-11-28 MED ORDER — ATORVASTATIN CALCIUM 40 MG PO TABS
40.0000 mg | ORAL_TABLET | Freq: Every day | ORAL | Status: DC
  Administered 2017-11-29 – 2017-11-30 (×2): 40 mg via ORAL
  Filled 2017-11-28 (×2): qty 1

## 2017-11-28 MED ORDER — MIDAZOLAM HCL 2 MG/2ML IJ SOLN
1.0000 mg | INTRAMUSCULAR | Status: DC
  Administered 2017-11-28: 1 mg via INTRAVENOUS
  Filled 2017-11-28: qty 2

## 2017-11-28 MED ORDER — ONDANSETRON HCL 4 MG PO TABS: 4.0000 mg | ORAL_TABLET | Freq: Four times a day (QID) | ORAL | Status: DC | PRN

## 2017-11-28 MED ORDER — BUPIVACAINE HCL (PF) 0.75 % IJ SOLN
INTRAMUSCULAR | Status: DC | PRN
  Administered 2017-11-28: 25 mL

## 2017-11-28 MED ORDER — DOCUSATE SODIUM 100 MG PO CAPS: 100.0000 mg | ORAL_CAPSULE | Freq: Two times a day (BID) | ORAL | 0 refills | Status: DC

## 2017-11-28 MED ORDER — PROPOFOL 500 MG/50ML IV EMUL
INTRAVENOUS | Status: DC | PRN
  Administered 2017-11-28: 75 ug/kg/min via INTRAVENOUS

## 2017-11-28 MED ORDER — KETOROLAC TROMETHAMINE 30 MG/ML IJ SOLN
INTRAMUSCULAR | Status: DC | PRN
  Administered 2017-11-28: 30 mg

## 2017-11-28 MED ORDER — PHENYLEPHRINE HCL 10 MG/ML IJ SOLN
INTRAMUSCULAR | Status: DC | PRN
  Administered 2017-11-28: 80 ug via INTRAVENOUS

## 2017-11-28 MED ORDER — SODIUM CHLORIDE 0.9 % IV SOLN
INTRAVENOUS | Status: DC
  Administered 2017-11-28 – 2017-11-29 (×2): via INTRAVENOUS

## 2017-11-28 SURGICAL SUPPLY — 50 items
ADH SKN CLS APL DERMABOND .7 (GAUZE/BANDAGES/DRESSINGS) ×1
BAG SPEC THK2 15X12 ZIP CLS (MISCELLANEOUS)
BAG ZIPLOCK 12X15 (MISCELLANEOUS) IMPLANT
BANDAGE ACE 6X5 VEL STRL LF (GAUZE/BANDAGES/DRESSINGS) ×3 IMPLANT
BLADE SAW SGTL 11.0X1.19X90.0M (BLADE) ×2 IMPLANT
BLADE SAW SGTL 13.0X1.19X90.0M (BLADE) ×3 IMPLANT
BOWL SMART MIX CTS (DISPOSABLE) ×3 IMPLANT
CAPT KNEE TOTAL 3 ATTUNE ×2 IMPLANT
CEMENT HV SMART SET (Cement) ×4 IMPLANT
COVER SURGICAL LIGHT HANDLE (MISCELLANEOUS) ×3 IMPLANT
CUFF TOURN SGL QUICK 34 (TOURNIQUET CUFF) ×3
CUFF TRNQT CYL 34X4X40X1 (TOURNIQUET CUFF) ×1 IMPLANT
DECANTER SPIKE VIAL GLASS SM (MISCELLANEOUS) ×3 IMPLANT
DERMABOND ADVANCED (GAUZE/BANDAGES/DRESSINGS) ×2
DERMABOND ADVANCED .7 DNX12 (GAUZE/BANDAGES/DRESSINGS) ×1 IMPLANT
DRAPE U-SHAPE 47X51 STRL (DRAPES) ×3 IMPLANT
DRESSING AQUACEL AG SP 3.5X10 (GAUZE/BANDAGES/DRESSINGS) ×1 IMPLANT
DRSG AQUACEL AG SP 3.5X10 (GAUZE/BANDAGES/DRESSINGS) ×3
DURAPREP 26ML APPLICATOR (WOUND CARE) ×6 IMPLANT
ELECT REM PT RETURN 15FT ADLT (MISCELLANEOUS) ×3 IMPLANT
GLOVE BIOGEL M 7.0 STRL (GLOVE) ×2 IMPLANT
GLOVE BIOGEL PI IND STRL 7.5 (GLOVE) ×1 IMPLANT
GLOVE BIOGEL PI IND STRL 8 (GLOVE) IMPLANT
GLOVE BIOGEL PI IND STRL 8.5 (GLOVE) IMPLANT
GLOVE BIOGEL PI INDICATOR 7.5 (GLOVE) ×4
GLOVE BIOGEL PI INDICATOR 8 (GLOVE) ×2
GLOVE BIOGEL PI INDICATOR 8.5 (GLOVE) ×4
GLOVE ECLIPSE 7.5 STRL STRAW (GLOVE) ×4 IMPLANT
GLOVE ECLIPSE 8.0 STRL XLNG CF (GLOVE) ×4 IMPLANT
GLOVE ORTHO TXT STRL SZ7.5 (GLOVE) ×6 IMPLANT
GLOVE SURG SS PI 7.5 STRL IVOR (GLOVE) ×2 IMPLANT
GOWN STRL REUS W/ TWL XL LVL3 (GOWN DISPOSABLE) IMPLANT
GOWN STRL REUS W/TWL 2XL LVL3 (GOWN DISPOSABLE) ×5 IMPLANT
GOWN STRL REUS W/TWL LRG LVL3 (GOWN DISPOSABLE) ×3 IMPLANT
GOWN STRL REUS W/TWL XL LVL3 (GOWN DISPOSABLE) ×3
HANDPIECE INTERPULSE COAX TIP (DISPOSABLE) ×3
MANIFOLD NEPTUNE II (INSTRUMENTS) ×3 IMPLANT
PACK TOTAL KNEE CUSTOM (KITS) ×3 IMPLANT
POSITIONER SURGICAL ARM (MISCELLANEOUS) ×3 IMPLANT
SET HNDPC FAN SPRY TIP SCT (DISPOSABLE) ×1 IMPLANT
SET PAD KNEE POSITIONER (MISCELLANEOUS) ×3 IMPLANT
SUT MNCRL AB 4-0 PS2 18 (SUTURE) ×3 IMPLANT
SUT STRATAFIX PDS+ 0 24IN (SUTURE) ×9 IMPLANT
SUT VIC AB 1 CT1 36 (SUTURE) ×3 IMPLANT
SUT VIC AB 2-0 CT1 27 (SUTURE) ×9
SUT VIC AB 2-0 CT1 TAPERPNT 27 (SUTURE) ×3 IMPLANT
SYR 50ML LL SCALE MARK (SYRINGE) ×3 IMPLANT
TRAY FOLEY CATH SILVER 14FR (SET/KITS/TRAYS/PACK) ×2 IMPLANT
WRAP KNEE MAXI GEL POST OP (GAUZE/BANDAGES/DRESSINGS) ×3 IMPLANT
YANKAUER SUCT BULB TIP 10FT TU (MISCELLANEOUS) ×3 IMPLANT

## 2017-11-28 NOTE — Discharge Instructions (Signed)

## 2017-11-28 NOTE — Anesthesia Procedure Notes (Signed)
Spinal  Start time: 11/28/2017 11:45 AM End time: 11/28/2017 11:49 AM Staffing Resident/CRNA: Kizzie Fantasiaarver, Bridgid Printz J, CRNA Performed: resident/CRNA  Preanesthetic Checklist Completed: patient identified, site marked, surgical consent, pre-op evaluation, timeout performed, IV checked, risks and benefits discussed and monitors and equipment checked Spinal Block Patient position: sitting Prep: Betadine Patient monitoring: heart rate, continuous pulse ox and blood pressure Approach: midline Location: L4-5 Injection technique: single-shot Needle Needle type: Quincke  Needle gauge: 22 G Needle length: 9 cm Needle insertion depth: 7 cm

## 2017-11-28 NOTE — Anesthesia Preprocedure Evaluation (Addendum)
Anesthesia Evaluation  Patient identified by MRN, date of birth, ID band Patient awake    Reviewed: Allergy & Precautions, NPO status , Patient's Chart, lab work & pertinent test results  Airway Mallampati: II  TM Distance: >3 FB Neck ROM: Full    Dental no notable dental hx. (+) Teeth Intact, Caps   Pulmonary former smoker,    Pulmonary exam normal breath sounds clear to auscultation       Cardiovascular hypertension, Pt. on medications Normal cardiovascular exam Rhythm:Regular Rate:Normal     Neuro/Psych Seizures -, Well Controlled,  Anxiety    GI/Hepatic Neg liver ROS, hiatal hernia, GERD  Medicated,  Endo/Other  Hypothyroidism   Renal/GU negative Renal ROS  negative genitourinary   Musculoskeletal  (+) Arthritis , Osteoarthritis,    Abdominal   Peds negative pediatric ROS (+)  Hematology  (+) anemia ,   Anesthesia Other Findings   Reproductive/Obstetrics negative OB ROS                            Anesthesia Physical Anesthesia Plan  ASA: III  Anesthesia Plan: Spinal   Post-op Pain Management:  Regional for Post-op pain   Induction:   PONV Risk Score and Plan: 2 and Treatment may vary due to age or medical condition and Ondansetron  Airway Management Planned: Nasal Cannula and Natural Airway  Additional Equipment:   Intra-op Plan:   Post-operative Plan:   Informed Consent: I have reviewed the patients History and Physical, chart, labs and discussed the procedure including the risks, benefits and alternatives for the proposed anesthesia with the patient or authorized representative who has indicated his/her understanding and acceptance.     Plan Discussed with: CRNA, Anesthesiologist and Surgeon  Anesthesia Plan Comments: (  )        Anesthesia Quick Evaluation

## 2017-11-28 NOTE — Anesthesia Procedure Notes (Signed)
Anesthesia Regional Block: Adductor canal block   Pre-Anesthetic Checklist: ,, timeout performed, Correct Patient, Correct Site, Correct Laterality, Correct Procedure, Correct Position, site marked, Risks and benefits discussed,  Surgical consent,  Pre-op evaluation,  At surgeon's request and post-op pain management  Laterality: Right  Prep: chloraprep       Needles:  Injection technique: Single-shot  Needle Type: Echogenic Stimulator Needle     Needle Length: 5cm  Needle Gauge: 22     Additional Needles:   Procedures:, nerve stimulator,,, ultrasound used (permanent image in chart),,,,  Narrative:  Start time: 11/28/2017 10:22 AM End time: 11/28/2017 10:27 AM Injection made incrementally with aspirations every 5 mL.  Performed by: Personally  Anesthesiologist: Bethena Midgetddono, Elanna Bert, MD  Additional Notes: Functioning IV was confirmed and monitors were applied.  A 50mm 22ga Arrow echogenic stimulator needle was used. Sterile prep and drape,hand hygiene and sterile gloves were used. Ultrasound guidance: relevant anatomy identified, needle position confirmed, local anesthetic spread visualized around nerve(s)., vascular puncture avoided.  Image printed for medical record. Negative aspiration and negative test dose prior to incremental administration of local anesthetic. The patient tolerated the procedure well.

## 2017-11-28 NOTE — Anesthesia Postprocedure Evaluation (Signed)
Anesthesia Post Note  Patient: Robin Horton  Procedure(s) Performed: RIGHT TOTAL KNEE ARTHROPLASTY (Right Knee)     Patient location during evaluation: PACU Anesthesia Type: Spinal Level of consciousness: oriented and awake and alert Pain management: pain level controlled Vital Signs Assessment: post-procedure vital signs reviewed and stable Respiratory status: spontaneous breathing, respiratory function stable and patient connected to nasal cannula oxygen Cardiovascular status: blood pressure returned to baseline and stable Postop Assessment: no headache, no backache and no apparent nausea or vomiting Anesthetic complications: no    Last Vitals:  Vitals:   11/28/17 1325 11/28/17 1330  BP: (!) 112/55 122/60  Pulse: 66 67  Resp: 16 17  Temp:    SpO2: 94% 95%    Last Pain:  Vitals:   11/28/17 1345  TempSrc:   PainSc: 0-No pain    LLE Motor Response: No movement due to regional block (11/28/17 1345) LLE Sensation: No sensation (absent) (11/28/17 1345) RLE Motor Response: No movement due to regional block (11/28/17 1345) RLE Sensation: No sensation (absent) (11/28/17 1345) L Sensory Level: L1-Inguinal (groin) region (11/28/17 1345) R Sensory Level: L1-Inguinal (groin) region (11/28/17 1345)  Franciszek Platten

## 2017-11-28 NOTE — Transfer of Care (Signed)
Immediate Anesthesia Transfer of Care Note  Patient: Robin Horton  Procedure(s) Performed: RIGHT TOTAL KNEE ARTHROPLASTY (Right Knee)  Patient Location: PACU  Anesthesia Type:Spinal  Level of Consciousness: awake, alert  and oriented  Airway & Oxygen Therapy: Patient Spontanous Breathing and Patient connected to face mask oxygen  Post-op Assessment: Report given to RN and Post -op Vital signs reviewed and stable  Post vital signs: Reviewed and stable  Last Vitals:  Vitals Value Taken Time  BP    Temp    Pulse 71 11/28/2017  1:23 PM  Resp 13 11/28/2017  1:23 PM  SpO2 93 % 11/28/2017  1:23 PM  Vitals shown include unvalidated device data.  Last Pain:  Vitals:   11/28/17 0939  TempSrc:   PainSc: 4       Patients Stated Pain Goal: 5 (11/28/17 0939)  Complications: No apparent anesthesia complications

## 2017-11-28 NOTE — Progress Notes (Signed)
AssistedDr. Oddono with right, ultrasound guided, adductor canal block. Side rails up, monitors on throughout procedure. See vital signs in flow sheet. Tolerated Procedure well.  

## 2017-11-28 NOTE — Interval H&P Note (Signed)
History and Physical Interval Note:  11/28/2017 10:51 AM  Robin Horton  has presented today for surgery, with the diagnosis of Right knee osteoarthritis  The various methods of treatment have been discussed with the patient and family. After consideration of risks, benefits and other options for treatment, the patient has consented to  Procedure(s): RIGHT TOTAL KNEE ARTHROPLASTY (Right) as a surgical intervention .  The patient's history has been reviewed, patient examined, no change in status, stable for surgery.  I have reviewed the patient's chart and labs.  Questions were answered to the patient's satisfaction.     Shelda PalMatthew D Chantale Leugers

## 2017-11-28 NOTE — Op Note (Signed)
NAME:  Robin Horton                      MEDICAL RECORD NO.:  [id number]                             FACILITY:  [facility]      PHYSICIAN:  Madlyn Frankel. Charlann Boxer, M.D.  DATE OF BIRTH:  May 27, 1941      DATE OF PROCEDURE:  11/28/2017                                     OPERATIVE REPORT         PREOPERATIVE DIAGNOSIS:  Right knee osteoarthritis.      POSTOPERATIVE DIAGNOSIS:  Right knee osteoarthritis.      FINDINGS:  The patient was noted to have complete loss of cartilage and   bone-on-bone arthritis with associated osteophytes in the medial and patellofemoral compartments of   the knee with a significant synovitis and associated effusion.      PROCEDURE:  Right total knee replacement.      COMPONENTS USED:  DePuy Attune rotating platform posterior stabilized knee   system, a size 4 femur, 5 tibia, size 8 mm PS AOX insert, and 38 anatomic patellar   button.      SURGEON:  Madlyn Frankel. Charlann Boxer, M.D.      ASSISTANT:  Lanney Gins, PA-C.      ANESTHESIA:  Regional and Spinal.      SPECIMENS:  None.      COMPLICATION:  None.      DRAINS:  None.  EBL: <100cc      TOURNIQUET TIME:   Total Tourniquet Time Documented: Thigh (Right) - 25 minutes Total: Thigh (Right) - 25 minutes  .      The patient was stable to the recovery room.      INDICATION FOR PROCEDURE:  Robin Horton is a 77 y.o. female patient of   mine.  The patient had been seen, evaluated, and treated conservatively in the   office with medication, activity modification, and injections.  The patient had   radiographic changes of bone-on-bone arthritis with endplate sclerosis and osteophytes noted.      The patient failed conservative measures including medication, injections, and activity modification, and at this point was ready for more definitive measures.   Based on the radiographic changes and failed conservative measures, the patient   decided to proceed with total knee replacement.  Risks of infection,   DVT,  component failure, need for revision surgery, postop course, and   expectations were all   discussed and reviewed.  Consent was obtained for benefit of pain   relief.      PROCEDURE IN DETAIL:  The patient was brought to the operative theater.   Once adequate anesthesia, preoperative antibiotics, 2 gm of Ancef, 1 gm of Tranexamic Acid, and 10 mg of Decadron administered, the patient was positioned supine with the right thigh tourniquet placed.  The  right lower extremity was prepped and draped in sterile fashion.  A time-   out was performed identifying the patient, planned procedure, and   extremity.      The right lower extremity was placed in the [facility] leg holder.  The leg was   exsanguinated, tourniquet elevated to 250 mmHg.  A midline incision was  made followed by median parapatellar arthrotomy.  Following initial   exposure, attention was first directed to the patella.  Precut   measurement was noted to be 24 mm.  I resected down to 14 mm and used a   38 anatomic patellar button to restore patellar height as well as cover the cut   surface.      The lug holes were drilled and a metal shim was placed to protect the   patella from retractors and saw blades.      At this point, attention was now directed to the femur.  The femoral   canal was opened with a drill, irrigated to try to prevent fat emboli.  An   intramedullary rod was passed at 3 degrees valgus, 9 mm of bone was   resected off the distal femur.  Following this resection, the tibia was   subluxated anteriorly.  Using the extramedullary guide, 2 mm of bone was resected off   the proximal medial tibia.  We confirmed the gap would be   stable medially and laterally with a size 6 spacer block as well as confirmed   the cut was perpendicular in the coronal plane, checking with an alignment rod.      Once this was done, I sized the femur to be a size 4 in the anterior-   posterior dimension, chose a standard component  based on medial and   lateral dimension.  The size 4 rotation block was then pinned in   position anterior referenced using the C-clamp to set rotation.  The   anterior, posterior, and  chamfer cuts were made without difficulty nor   notching making certain that I was along the anterior cortex to help   with flexion gap stability.      The final box cut was made off the lateral aspect of distal femur.      At this point, the tibia was sized to be a size 5, the size 5 tray was   then pinned in position through the medial third of the tubercle,   drilled, and keel punched.  Trial reduction was now carried with a 4 femur,  5 tibia, a size 6 then 8 mm PS insert, and the 38 anatomic patella botton.  The knee was brought to   extension, full extension with good flexion stability with the patella   tracking through the trochlea without application of pressure.  Given   all these findings the femoral lug holes were drilled and then the trial components removed.  Final components were   opened and cement was mixed.  The knee was irrigated with normal saline   solution and pulse lavage.  The synovial lining was   then injected with 30 cc of 0.25% Marcaine with epinephrine and 1 cc of Toradol plus 30 cc of NS for a total of 61 cc.      The knee was irrigated.  Final implants were then cemented onto clean and   dried cut surfaces of bone with the knee brought to extension with a size 8 mm PS trial insert.      Once the cement had fully cured, the excess cement was removed   throughout the knee.  I confirmed I was satisfied with the range of   motion and stability, and the final size 8 mm PS AOX insert was chosen.  It was   placed into the knee.      The tourniquet had been let  down at 26 minutes.  No significant   hemostasis required.  The   extensor mechanism was then reapproximated using #1 Vicryl and #1 Stratafix sutures with the knee   in flexion.  The   remaining wound was closed with 2-0  Vicryl and running 4-0 Monocryl.   The knee was cleaned, dried, dressed sterilely using Dermabond and   Aquacel dressing.  The patient was then   brought to recovery room in stable condition, tolerating the procedure   well.   Please note that Physician Assistant, Lanney Gins, PA-C, was present for the entirety of the case, and was utilized for pre-operative positioning, peri-operative retractor management, general facilitation of the procedure.  He was also utilized for primary wound closure at the end of the case.              Madlyn Frankel Charlann Boxer, M.D.    11/28/2017 1:03 PM

## 2017-11-29 DIAGNOSIS — G40409 Other generalized epilepsy and epileptic syndromes, not intractable, without status epilepticus: Secondary | ICD-10-CM | POA: Diagnosis present

## 2017-11-29 DIAGNOSIS — M25561 Pain in right knee: Secondary | ICD-10-CM | POA: Diagnosis not present

## 2017-11-29 DIAGNOSIS — E669 Obesity, unspecified: Secondary | ICD-10-CM | POA: Diagnosis present

## 2017-11-29 DIAGNOSIS — Z7982 Long term (current) use of aspirin: Secondary | ICD-10-CM | POA: Diagnosis not present

## 2017-11-29 DIAGNOSIS — I10 Essential (primary) hypertension: Secondary | ICD-10-CM | POA: Diagnosis present

## 2017-11-29 DIAGNOSIS — Z96652 Presence of left artificial knee joint: Secondary | ICD-10-CM | POA: Diagnosis present

## 2017-11-29 DIAGNOSIS — G2581 Restless legs syndrome: Secondary | ICD-10-CM | POA: Diagnosis present

## 2017-11-29 DIAGNOSIS — K449 Diaphragmatic hernia without obstruction or gangrene: Secondary | ICD-10-CM | POA: Diagnosis present

## 2017-11-29 DIAGNOSIS — K219 Gastro-esophageal reflux disease without esophagitis: Secondary | ICD-10-CM | POA: Diagnosis present

## 2017-11-29 DIAGNOSIS — E785 Hyperlipidemia, unspecified: Secondary | ICD-10-CM | POA: Diagnosis present

## 2017-11-29 DIAGNOSIS — Z79899 Other long term (current) drug therapy: Secondary | ICD-10-CM | POA: Diagnosis not present

## 2017-11-29 DIAGNOSIS — E039 Hypothyroidism, unspecified: Secondary | ICD-10-CM | POA: Diagnosis present

## 2017-11-29 DIAGNOSIS — Z79890 Hormone replacement therapy (postmenopausal): Secondary | ICD-10-CM | POA: Diagnosis not present

## 2017-11-29 DIAGNOSIS — M1711 Unilateral primary osteoarthritis, right knee: Secondary | ICD-10-CM | POA: Diagnosis present

## 2017-11-29 DIAGNOSIS — D509 Iron deficiency anemia, unspecified: Secondary | ICD-10-CM | POA: Diagnosis present

## 2017-11-29 DIAGNOSIS — E559 Vitamin D deficiency, unspecified: Secondary | ICD-10-CM | POA: Diagnosis present

## 2017-11-29 DIAGNOSIS — Z6837 Body mass index (BMI) 37.0-37.9, adult: Secondary | ICD-10-CM | POA: Diagnosis not present

## 2017-11-29 DIAGNOSIS — F419 Anxiety disorder, unspecified: Secondary | ICD-10-CM | POA: Diagnosis present

## 2017-11-29 LAB — CBC
HEMATOCRIT: 35.9 % — AB (ref 36.0–46.0)
Hemoglobin: 11.8 g/dL — ABNORMAL LOW (ref 12.0–15.0)
MCH: 31.2 pg (ref 26.0–34.0)
MCHC: 32.9 g/dL (ref 30.0–36.0)
MCV: 95 fL (ref 78.0–100.0)
Platelets: 225 10*3/uL (ref 150–400)
RBC: 3.78 MIL/uL — ABNORMAL LOW (ref 3.87–5.11)
RDW: 12.7 % (ref 11.5–15.5)
WBC: 12 10*3/uL — ABNORMAL HIGH (ref 4.0–10.5)

## 2017-11-29 LAB — BASIC METABOLIC PANEL
Anion gap: 9 (ref 5–15)
BUN: 21 mg/dL — ABNORMAL HIGH (ref 6–20)
CALCIUM: 8.7 mg/dL — AB (ref 8.9–10.3)
CHLORIDE: 105 mmol/L (ref 101–111)
CO2: 26 mmol/L (ref 22–32)
CREATININE: 1.1 mg/dL — AB (ref 0.44–1.00)
GFR calc Af Amer: 55 mL/min — ABNORMAL LOW (ref >60)
GFR calc non Af Amer: 48 mL/min — ABNORMAL LOW (ref >60)
Glucose, Bld: 135 mg/dL — ABNORMAL HIGH (ref 65–99)
Potassium: 3.7 mmol/L (ref 3.5–5.1)
Sodium: 140 mmol/L (ref 135–145)

## 2017-11-29 MED ORDER — ALUM & MAG HYDROXIDE-SIMETH 200-200-20 MG/5ML PO SUSP
15.0000 mL | ORAL | Status: DC | PRN
Start: 2017-11-29 — End: 2017-11-30
  Administered 2017-11-29: 16:00:00 15 mL via ORAL
  Filled 2017-11-29: qty 30

## 2017-11-29 NOTE — Progress Notes (Signed)
Physical Therapy Treatment Patient Details Name: Robin Horton MRN: [id number] DOB: Feb 14, 1941 Today's Date: 11/29/2017    History of Present Illness s/p R TKA; PMHx: L TKA    PT Comments    Exercise focused session, deferred OOB as pt wished to take a nap--OT to see later;    Follow Up Recommendations  Follow surgeon's recommendation for DC plan and follow-up therapies;Outpatient PT     Equipment Recommendations  None recommended by PT    Recommendations for Other Services       Precautions / Restrictions Precautions Precautions: Fall;Knee Restrictions Weight Bearing Restrictions: No    Mobility  Bed Mobility               General bed mobility comments: deferred OOB this pm per pt request to take a nap  Transfers                    Ambulation/Gait                 Stairs            Wheelchair Mobility    Modified Rankin (Stroke Patients Only)       Balance                                            Cognition Arousal/Alertness: Awake/alert Behavior During Therapy: WFL for tasks assessed/performed Overall Cognitive Status: Within Functional Limits for tasks assessed                                        Exercises Total Joint Exercises Ankle Circles/Pumps: AROM;Both;10 reps Quad Sets: AROM;10 reps;Both Towel Squeeze: AROM;Both;10 reps Short Arc Quad: AROM;Right;10 reps Heel Slides: AAROM;Right;10 reps Hip ABduction/ADduction: AROM;Right;10 reps Straight Leg Raises: AROM;10 reps;Right Long Arc Quad: AROM;Strengthening;Right;10 reps    General Comments        Pertinent Vitals/Pain Pain Assessment: 0-10 Pain Score: 4  Pain Location: right knee Pain Descriptors / Indicators: Discomfort;Sore Pain Intervention(s): Monitored during session;Premedicated before session    Home Living Family/patient expects to be discharged to:: Private residence Living Arrangements: Alone Available  Help at Discharge: Family;Available PRN/intermittently Type of Home: Apartment       Home Equipment: Tub bench Additional Comments: dtr lives across the street, checks on her in the evening    Prior Function Level of Independence: Independent          PT Goals (current goals can now be found in the care plan section) Acute Rehab PT Goals Patient Stated Goal: be able to walk with her friends PT Goal Formulation: With patient Time For Goal Achievement: 12/06/17 Potential to Achieve Goals: Good Progress towards PT goals: Progressing toward goals    Frequency    7X/week      PT Plan Current plan remains appropriate    Co-evaluation              AM-PAC PT "6 Clicks" Daily Activity  Outcome Measure  Difficulty turning over in bed (including adjusting bedclothes, sheets and blankets)?: A Little Difficulty moving from lying on back to sitting on the side of the bed? : A Little Difficulty sitting down on and standing up from a chair with arms (e.g., wheelchair, bedside commode, etc,.)?: Unable Help needed moving to and  from a bed to chair (including a wheelchair)?: A Little Help needed walking in hospital room?: A Little Help needed climbing 3-5 steps with a railing? : A Lot 6 Click Score: 15    End of Session Equipment Utilized During Treatment: Gait belt Activity Tolerance: Patient tolerated treatment well Patient left: in bed;with call bell/phone within reach;with bed alarm set   PT Visit Diagnosis: Difficulty in walking, not elsewhere classified (R26.2)     Time: 1610-9604 PT Time Calculation (min) (ACUTE ONLY): 13 min  Charges:  $Therapeutic Exercise: 8-22 mins                    G CodesDrucilla Horton, PT Pager: [phone] 11/29/2017    [facility] 11/29/2017, 3:33 PM

## 2017-11-29 NOTE — Evaluation (Signed)
Occupational Therapy Evaluation Patient Details Name: Kevina Piloto MRN: [id number] DOB: [birth year] Today's Date: 11/29/2017    History of Present Illness s/p R TKA; PMHx: L TKA   Clinical Impression   This 77 year old female was admitted for the above sx.  Will follow in acute setting with supervision level goals.  Pt was independent prior to admission and mostly min guard to min A at this time    Follow Up Recommendations  Supervision - Intermittent    Equipment Recommendations  None recommended by OT    Recommendations for Other Services       Precautions / Restrictions Precautions Precautions: Fall;Knee Restrictions Weight Bearing Restrictions: No      Mobility Bed Mobility               General bed mobility comments: supervision for in and out of bed  Transfers                sit to stand:  Min guard Cues for LE placement      Balance                                           ADL either performed or assessed with clinical judgement   ADL Overall ADL's : Needs assistance/impaired Eating/Feeding: Independent   Grooming: Oral care;Wash/dry hands;Supervision/safety;Standing   Upper Body Bathing: Set up;Sitting   Lower Body Bathing: Minimal assistance;Sit to/from stand   Upper Body Dressing : Set up;Sitting   Lower Body Dressing: Minimal assistance;Sit to/from stand   Toilet Transfer: Min guard;Ambulation;Comfort height toilet;RW   Toileting- Architect and Hygiene: Min guard;Sit to/from stand         General ADL Comments: ambulated to bathroom and completed ADL sitting in chair at sink. Pt tends to move a little quickly. Reviewed safety of keeping RW around her body and sidestepping through tight spaces. Talked about getting into tub/shower with grab bars; she then told me she has a long tub bench from her mother. That will be her safest option.  Pt verbalizes understanding of knee precaution     Vision          Perception     Praxis      Pertinent Vitals/Pain Pain Assessment: 0-10 Pain Score: 4  Pain Location: right knee Pain Descriptors / Indicators: Discomfort;Sore Pain Intervention(s): Monitored during session;Premedicated before session     Hand Dominance     Extremity/Trunk Assessment Upper Extremity Assessment Upper Extremity Assessment: Overall WFL for tasks assessed           Communication Communication Communication: No difficulties   Cognition Arousal/Alertness: Awake/alert Behavior During Therapy: WFL for tasks assessed/performed Overall Cognitive Status: Within Functional Limits for tasks assessed                                     General Comments       Exercises     Shoulder Instructions      Home Living Family/patient expects to be discharged to:: Private residence Living Arrangements: Alone Available Help at Discharge: Family;Available PRN/intermittently Type of Home: Apartment             Bathroom Shower/Tub: Tub/shower unit   Bathroom Toilet: Handicapped height     Home Equipment: Tub bench   Additional Comments: dtr  lives across the street, checks on her in the evening      Prior Functioning/Environment Level of Independence: Independent                 OT Problem List: Pain;Decreased knowledge of use of DME or AE      OT Treatment/Interventions: Self-care/ADL training;DME and/or AE instruction;Patient/family education    OT Goals(Current goals can be found in the care plan section) Acute Rehab OT Goals Patient Stated Goal: be able to walk with her friends OT Goal Formulation: With patient Time For Goal Achievement: 12/13/17 Potential to Achieve Goals: Good ADL Goals Pt Will Perform Tub/Shower Transfer: Tub transfer;ambulating;tub bench;with supervision Additional ADL Goal #1: pt will gather clothes at supervision level with RW without cues for safety  OT Frequency: Min 2X/week   Barriers to D/C:             Co-evaluation              AM-PAC PT "6 Clicks" Daily Activity     Outcome Measure Help from another person eating meals?: None Help from another person taking care of personal grooming?: A Little Help from another person toileting, which includes using toliet, bedpan, or urinal?: A Little Help from another person bathing (including washing, rinsing, drying)?: A Little Help from another person to put on and taking off regular upper body clothing?: A Little Help from another person to put on and taking off regular lower body clothing?: A Little 6 Click Score: 19   End of Session    Activity Tolerance: Patient tolerated treatment well Patient left: in bed;with call bell/phone within reach;with bed alarm set  OT Visit Diagnosis: Pain Pain - Right/Left: Right Pain - part of body: Knee                Time: 1434-1511 OT Time Calculation (min): 37 min Charges:  OT General Charges $OT Visit: 1 Visit OT Evaluation $OT Eval Low Complexity: 1 Low OT Treatments $Self Care/Home Management : 8-22 mins G-Codes:     HoltMaryellen Magdaleno Lortie, OTR/L [phone] 11/29/2017  Karver Fadden 11/29/2017, 3:31 PM

## 2017-11-29 NOTE — Evaluation (Signed)
Physical Therapy Evaluation Patient Details Name: Robin Horton MRN: [id number] DOB: Sep 09, 1940 Today's Date: 11/29/2017   History of Present Illness  s/p R TKA; PMHx: L TKA  Clinical Impression  Pt is s/p TKA resulting in the deficits listed below (see PT Problem List). Pt doing well, amb 48' with RW and min/guard to min assist for safety and balance; Pt has supportive dtr that works days, have ordered OT Consult, will continue to follow; Pt will benefit from skilled PT to increase their independence and safety with mobility to allow discharge to the venue listed below.      Follow Up Recommendations Follow surgeon's recommendation for DC plan and follow-up therapies;Outpatient PT    Equipment Recommendations  None recommended by PT    Recommendations for Other Services       Precautions / Restrictions Precautions Precautions: Fall;Knee Restrictions Weight Bearing Restrictions: No      Mobility  Bed Mobility Overal bed mobility: Needs Assistance Bed Mobility: Supine to Sit     Supine to sit: Min guard     General bed mobility comments: for safety, mild dizziness d/t meds  Transfers Overall transfer level: Needs assistance Equipment used: Rolling walker (2 wheeled) Transfers: Sit to/from Stand Sit to Stand: Min assist;Min guard         General transfer comment: cues for hand placement, incr time  Ambulation/Gait Ambulation/Gait assistance: Min guard;Min assist Ambulation Distance (Feet): 80 Feet Assistive device: Rolling walker (2 wheeled) Gait Pattern/deviations: Step-through pattern;Decreased weight shift to right     General Gait Details: cues for sequence, RW position  Stairs            Wheelchair Mobility    Modified Rankin (Stroke Patients Only)       Balance                                             Pertinent Vitals/Pain Pain Assessment: 0-10 Pain Score: 3  Pain Location: right knee Pain Descriptors /  Indicators: Discomfort;Sore Pain Intervention(s): Premedicated before session;Monitored during session;Limited activity within patient's tolerance;Repositioned;Ice applied    Home Living Family/patient expects to be discharged to:: Private residence Living Arrangements: Alone Available Help at Discharge: Family;Available PRN/intermittently Type of Home: Apartment Home Access: Level entry     Home Layout: One level Home Equipment: Cane - single point;Walker - 2 wheels;Walker - 4 wheels Additional Comments: dtr lives across the street, checks on her in the evening    Prior Function Level of Independence: Independent               Hand Dominance        Extremity/Trunk Assessment   Upper Extremity Assessment Upper Extremity Assessment: Defer to OT evaluation    Lower Extremity Assessment Lower Extremity Assessment: RLE deficits/detail RLE Deficits / Details: knee extension and hip flexion 3/5, ankle WFL       Communication   Communication: No difficulties  Cognition Arousal/Alertness: Awake/alert Behavior During Therapy: WFL for tasks assessed/performed Overall Cognitive Status: Within Functional Limits for tasks assessed                                        General Comments      Exercises Total Joint Exercises Ankle Circles/Pumps: AROM;Both;10 reps Quad Sets: AROM;10 reps;Both Straight  Leg Raises: AROM;10 reps;Right   Assessment/Plan    PT Assessment Patient needs continued PT services  PT Problem List Decreased strength;Decreased range of motion;Decreased mobility;Pain;Decreased activity tolerance;Decreased knowledge of precautions;Decreased knowledge of use of DME       PT Treatment Interventions DME instruction;Gait training;Functional mobility training;Therapeutic activities;Therapeutic exercise;Patient/family education    PT Goals (Current goals can be found in the Care Plan section)  Acute Rehab PT Goals Patient Stated Goal: be  able to walk with her friends PT Goal Formulation: With patient Time For Goal Achievement: 12/06/17 Potential to Achieve Goals: Good    Frequency 7X/week   Barriers to discharge        Co-evaluation               AM-PAC PT "6 Clicks" Daily Activity  Outcome Measure Difficulty turning over in bed (including adjusting bedclothes, sheets and blankets)?: A Little Difficulty moving from lying on back to sitting on the side of the bed? : A Little Difficulty sitting down on and standing up from a chair with arms (e.g., wheelchair, bedside commode, etc,.)?: Unable Help needed moving to and from a bed to chair (including a wheelchair)?: A Little Help needed walking in hospital room?: A Little Help needed climbing 3-5 steps with a railing? : A Lot 6 Click Score: 15    End of Session Equipment Utilized During Treatment: Gait belt Activity Tolerance: Patient tolerated treatment well Patient left: in chair;with call bell/phone within reach;with chair alarm set   PT Visit Diagnosis: Difficulty in walking, not elsewhere classified (R26.2)    Time: 1610-96041027-1051 PT Time Calculation (min) (ACUTE ONLY): 24 min   Charges:   PT Evaluation $PT Eval Low Complexity: 1 Low PT Treatments $Gait Training: 8-22 mins   PT G CodesDrucilla Chalet:        Antanisha Mohs, PT Pager: [phone] 11/29/2017   Drucilla ChaletWILLIAMS,Robin Jhaveri 11/29/2017, 11:08 AM

## 2017-11-29 NOTE — Progress Notes (Signed)
Patient ID: Robin Horton, female   DOB: 1940/10/01, 77 y.o.   MRN: [id number] Subjective: 1 Day Post-Op Procedure(s) (LRB): RIGHT TOTAL KNEE ARTHROPLASTY (Right)    Patient reports pain as mild to moderate. No events reported.  Planning on home discharge  Objective:   VITALS:   Vitals:   11/29/17 0040 11/29/17 0540  BP: (!) 156/72 134/63  Pulse: 89   Resp: 16 17  Temp: 98.1 F (36.7 C) 97.8 F (36.6 C)  SpO2: 99% 98%    Neurovascular intact Incision: dressing C/D/I  LABS Recent Labs    11/29/17 0626  HGB 11.8*  HCT 35.9*  WBC 12.0*  PLT 225    Recent Labs    11/29/17 0626  NA 140  K 3.7  BUN 21*  CREATININE 1.10*  GLUCOSE 135*    No results for input(s): LABPT, INR in the last 72 hours.   Assessment/Plan: 1 Day Post-Op Procedure(s) (LRB): RIGHT TOTAL KNEE ARTHROPLASTY (Right)   Advance diet Up with therapy Plan for discharge tomorrow   Here today for pain control, in patient therapy to assure safety for home discharge with outpt PT Medical management/observation for co-morbidities  Past Medical History:  Diagnosis Date  . Dyslipidemia   . GERD (gastroesophageal reflux disease)   . History of bronchitis   . History of palpitations   . History of short term memory loss   . Hypertension   . Hypothyroidism   . OA (osteoarthritis) of knee    Left  . Restless legs syndrome (RLS)   . Seizures (HCC)    Myoclonic epilepsy  . Vitamin D deficiency

## 2017-11-30 DIAGNOSIS — E669 Obesity, unspecified: Secondary | ICD-10-CM | POA: Diagnosis present

## 2017-11-30 LAB — BASIC METABOLIC PANEL
Anion gap: 11 (ref 5–15)
BUN: 29 mg/dL — AB (ref 6–20)
CALCIUM: 8.4 mg/dL — AB (ref 8.9–10.3)
CO2: 23 mmol/L (ref 22–32)
CREATININE: 1.14 mg/dL — AB (ref 0.44–1.00)
Chloride: 103 mmol/L (ref 101–111)
GFR calc Af Amer: 53 mL/min — ABNORMAL LOW (ref >60)
GFR, EST NON AFRICAN AMERICAN: 46 mL/min — AB (ref >60)
GLUCOSE: 97 mg/dL (ref 65–99)
Potassium: 4.1 mmol/L (ref 3.5–5.1)
Sodium: 137 mmol/L (ref 135–145)

## 2017-11-30 LAB — CBC
HEMATOCRIT: 33.8 % — AB (ref 36.0–46.0)
Hemoglobin: 11.2 g/dL — ABNORMAL LOW (ref 12.0–15.0)
MCH: 31.6 pg (ref 26.0–34.0)
MCHC: 33.1 g/dL (ref 30.0–36.0)
MCV: 95.5 fL (ref 78.0–100.0)
Platelets: 223 10*3/uL (ref 150–400)
RBC: 3.54 MIL/uL — AB (ref 3.87–5.11)
RDW: 13 % (ref 11.5–15.5)
WBC: 12.2 10*3/uL — AB (ref 4.0–10.5)

## 2017-11-30 NOTE — Progress Notes (Signed)
Occupational Therapy Treatment Patient Details Name: Robin AlconKaron Maye Otting MRN: [id number] DOB: [birth year] Today's Date: 11/30/2017    History of present illness s/p R TKA; PMHx: L TKA   OT comments  All education completed this session  Follow Up Recommendations  Supervision - Intermittent    Equipment Recommendations  None recommended by OT    Recommendations for Other Services      Precautions / Restrictions Precautions Precautions: Fall;Knee Restrictions Weight Bearing Restrictions: No       Mobility Bed Mobility         Supine to sit: Supervision        Transfers   Equipment used: Rolling walker (2 wheeled)   Sit to Stand: Supervision              Balance                                           ADL either performed or assessed with clinical judgement   ADL       Grooming: Wash/dry hands;Standing;Supervision/safety               Lower Body Dressing: Minimal assistance;Sit to/from stand(pants sit to stand)   Toilet Transfer: Supervision/safety;Ambulation;Comfort height toilet;RW   Toileting- Clothing Manipulation and Hygiene: Supervision/safety;Sit to/from stand         General ADL Comments: ambulated to closet to gather clothes wth supervision.   Pt donned clothes in preparation for d/c (except for shoes) brought tub bench and demonstrated use.       Vision       Perception     Praxis      Cognition Arousal/Alertness: Awake/alert Behavior During Therapy: WFL for tasks assessed/performed Overall Cognitive Status: Within Functional Limits for tasks assessed                                          Exercises     Shoulder Instructions       General Comments      Pertinent Vitals/ Pain       Pain Score: 5  Pain Location: right knee Pain Descriptors / Indicators: Discomfort;Sore Pain Intervention(s): Limited activity within patient's tolerance;Monitored during session;Premedicated  before session;Repositioned;Ice applied  Home Living                                          Prior Functioning/Environment              Frequency           Progress Toward Goals  OT Goals(current goals can now be found in the care plan section)        Plan      Co-evaluation                 AM-PAC PT "6 Clicks" Daily Activity     Outcome Measure   Help from another person eating meals?: None Help from another person taking care of personal grooming?: A Little Help from another person toileting, which includes using toliet, bedpan, or urinal?: A Little Help from another person bathing (including washing, rinsing, drying)?: A Little Help from another person to put on and taking off regular  upper body clothing?: A Little Help from another person to put on and taking off regular lower body clothing?: A Lot 6 Click Score: 18    End of Session    OT Visit Diagnosis: Pain Pain - Right/Left: Right Pain - part of body: Knee   Activity Tolerance Patient tolerated treatment well   Patient Left in chair;with call bell/phone within reach   Nurse Communication          Time: 1610-9604 OT Time Calculation (min): 20 min  Charges: OT General Charges $OT Visit: 1 Visit OT Treatments $Self Care/Home Management : 8-22 mins  Marica Otter, OTR/L [phone] 11/30/2017   Pavle Wiler 11/30/2017, 8:38 AM

## 2017-11-30 NOTE — Progress Notes (Signed)
Physical Therapy Treatment Patient Details Name: Robin AlconKaron Maye Parco MRN: [id number] DOB: 01-Oct-1940 Today's Date: 11/30/2017    History of Present Illness s/p R TKA; PMHx: L TKA    PT Comments    Pt progressing well; all questions and concerns addressed regarding mobility and pt reports she feels ready to d/c home  Follow Up Recommendations  Follow surgeon's recommendation for DC plan and follow-up therapies;Outpatient PT     Equipment Recommendations  None recommended by PT    Recommendations for Other Services       Precautions / Restrictions Precautions Precautions: Fall;Knee Restrictions Weight Bearing Restrictions: No    Mobility  Bed Mobility   Bed Mobility: Sit to Supine     Supine to sit: Supervision Sit to supine: Min guard   General bed mobility comments: instructed in useof sheet loop to self assist RLE on bed  Transfers Overall transfer level: Needs assistance Equipment used: Rolling walker (2 wheeled) Transfers: Sit to/from Stand Sit to Stand: Supervision         General transfer comment: cues for hand placement, incr time  Ambulation/Gait Ambulation/Gait assistance: Supervision Ambulation Distance (Feet): 110 Feet Assistive device: Rolling walker (2 wheeled) Gait Pattern/deviations: Step-through pattern;Decreased weight shift to right;Step-to pattern     General Gait Details: cues for sequence, gait progression, RW position   Stairs            Wheelchair Mobility    Modified Rankin (Stroke Patients Only)       Balance                                            Cognition Arousal/Alertness: Awake/alert Behavior During Therapy: WFL for tasks assessed/performed Overall Cognitive Status: Within Functional Limits for tasks assessed                                        Exercises Total Joint Exercises Ankle Circles/Pumps: AROM;Both;10 reps Quad Sets: AROM;10 reps;Both Short Arc Quad:  AROM;Right;10 reps Heel Slides: AAROM;Right;10 reps Hip ABduction/ADduction: AROM;Right;10 reps Straight Leg Raises: AROM;10 reps;Right;AAROM Goniometric ROM: 10--65* AAROM right knee    General Comments        Pertinent Vitals/Pain Pain Assessment: 0-10 Pain Score: 4  Pain Location: right knee Pain Descriptors / Indicators: Discomfort;Sore Pain Intervention(s): Monitored during session;Ice applied;Repositioned    Home Living                      Prior Function            PT Goals (current goals can now be found in the care plan section) Acute Rehab PT Goals Patient Stated Goal: be able to walk with her friends PT Goal Formulation: With patient Time For Goal Achievement: 12/06/17 Potential to Achieve Goals: Good Progress towards PT goals: Progressing toward goals    Frequency    7X/week      PT Plan Current plan remains appropriate    Co-evaluation              AM-PAC PT "6 Clicks" Daily Activity  Outcome Measure  Difficulty turning over in bed (including adjusting bedclothes, sheets and blankets)?: A Little Difficulty moving from lying on back to sitting on the side of the bed? : A Little Difficulty sitting down on  and standing up from a chair with arms (e.g., wheelchair, bedside commode, etc,.)?: A Little Help needed moving to and from a bed to chair (including a wheelchair)?: A Little Help needed walking in hospital room?: A Little Help needed climbing 3-5 steps with a railing? : A Little 6 Click Score: 18    End of Session Equipment Utilized During Treatment: Gait belt Activity Tolerance: Patient tolerated treatment well Patient left: in bed;with call bell/phone within reach;with bed alarm set Nurse Communication: Mobility status;Other (comment)(ready fo rd/c) PT Visit Diagnosis: Difficulty in walking, not elsewhere classified (R26.2)     Time: 1010-1039 PT Time Calculation (min) (ACUTE ONLY): 29 min  Charges:  $Gait Training: 8-22  mins $Therapeutic Exercise: 8-22 mins                    G CodesDrucilla Chalet, PT Pager: [phone] 11/30/2017    [facility] 11/30/2017, 10:49 AM

## 2017-11-30 NOTE — Progress Notes (Signed)
     Subjective: 2 Days Post-Op Procedure(s) (LRB): RIGHT TOTAL KNEE ARTHROPLASTY (Right)   Patient reports pain as mild, pain controlled.  No events throughout the night. Feels that she is working well with PT.  Ready to be discharged home.   Objective:   VITALS:   Vitals:   11/29/17 1959 11/30/17 0540  BP: (!) 164/77 (!) 159/75  Pulse: 97 88  Resp: 16 15  Temp: 98.4 F (36.9 C) 97.8 F (36.6 C)  SpO2: 99% 98%    Dorsiflexion/Plantar flexion intact Incision: dressing C/D/I No cellulitis present Compartment soft  LABS Recent Labs    11/29/17 0626 11/30/17 0602  HGB 11.8* 11.2*  HCT 35.9* 33.8*  WBC 12.0* 12.2*  PLT 225 223    Recent Labs    11/29/17 0626 11/30/17 0602  NA 140 137  K 3.7 4.1  BUN 21* 29*  CREATININE 1.10* 1.14*  GLUCOSE 135* 97     Assessment/Plan: 2 Days Post-Op Procedure(s) (LRB): RIGHT TOTAL KNEE ARTHROPLASTY (Right) Up with therapy Discharge home [facility]. Follow up with OLIN,Gracia Saggese D in 2 weeks.  Contact information:  [facility] [address] [place] [id number] [phone]    Obese (BMI 30-39.9) Estimated body mass index is 37.5 kg/m as calculated from the following:   Height as of this encounter: 5\' 2"  (1.575 m).   Weight as of this encounter: 93 kg (205 lb 0.4 oz). Patient also counseled that weight may inhibit the healing process Patient counseled that losing weight will help with future health issues       Anastasio AuerbachMatthew S. Avarey Yaeger   PAC  11/30/2017, 8:24 AM

## 2017-12-02 DIAGNOSIS — M25561 Pain in right knee: Secondary | ICD-10-CM | POA: Diagnosis not present

## 2017-12-05 DIAGNOSIS — M25561 Pain in right knee: Secondary | ICD-10-CM | POA: Diagnosis not present

## 2017-12-05 NOTE — Discharge Summary (Signed)
Physician Discharge Summary  Patient ID: Robin Horton MRN: [id number] DOB/AGE: 1941/07/24 77 y.o.  Admit date: 11/28/2017 Discharge date: 11/30/2017   Procedures:  Procedure(s) (LRB): RIGHT TOTAL KNEE ARTHROPLASTY (Right)  Attending Physician:  Dr. Durene Romans   Admission Diagnoses:   Right knee primary OA / pain  Discharge Diagnoses:  Principal Problem:   S/P right TKA Active Problems:   Obese  Past Medical History:  Diagnosis Date  . Anxiety   . Dyslipidemia   . GERD (gastroesophageal reflux disease)   . History of bronchitis   . History of hiatal hernia   . History of palpitations   . History of short term memory loss   . Hypertension   . Hypothyroidism   . Iron deficiency anemia   . OA (osteoarthritis) of knee    Left  . Restless legs syndrome (RLS)   . Seizures (HCC)    Myoclonic epilepsy  . Urinary incontinence   . Vitamin D deficiency     HPI:    Robin Horton, 77 y.o. female, has a history of pain and functional disability in the right knee due to arthritis and has failed non-surgical conservative treatments for greater than 12 weeks to include NSAID's and/or analgesics, use of assistive devices and activity modification.  Onset of symptoms was gradual, starting ~3 years ago with gradually worsening course since that time. The patient noted prior procedures on the knee to include  arthroplasty on the left knee in about 1985 in [facility].  Patient currently rates pain in the right knee(s) at 9 out of 10 with activity. Patient has night pain, worsening of pain with activity and weight bearing, pain that interferes with activities of daily living, pain with passive range of motion, crepitus and joint swelling.  Patient has evidence of periarticular osteophytes and joint space narrowing by imaging studies.  There is no active infection.  Risks, benefits and expectations were discussed with the patient.  Risks including but not limited to the risk of anesthesia,  blood clots, nerve damage, blood vessel damage, failure of the prosthesis, infection and up to and including death.  Patient understand the risks, benefits and expectations and wishes to proceed with surgery.   PCP: Iva Boop, MD   Discharged Condition: good  Hospital Course:  Patient underwent the above stated procedure on 11/28/2017. Patient tolerated the procedure well and brought to the recovery room in good condition and subsequently to the floor.  POD #1 BP: 134/63 ; Pulse: 89 ; Temp: 97.8 F (36.6 C) ; Resp: 17 Patient reports pain as mild to moderate. No events reported.  Planning on home discharge. Neurovascular intact and incision: dressing C/D/I.   LABS  Basename    HGB     11.8  HCT     35.9   POD #2  BP: 159/75 ; Pulse: 88 ; Temp: 97.8 F (36.6 C) ; Resp: 15 Patient reports pain as mild, pain controlled.  No events throughout the night. Feels that she is working well with PT.  Ready to be discharged home.  Dorsiflexion/plantar flexion intact, incision: dressing C/D/I, no cellulitis present and compartment soft.   LABS  Basename    HGB     11.2  HCT     33.8    Discharge Exam: General appearance: alert, cooperative and no distress Extremities: Homans sign is negative, no sign of DVT, no edema, redness or tenderness in the calves or thighs and no ulcers, gangrene or trophic changes  Disposition:  Home with follow up in 2 weeks   Follow-up Information    Durene Romans, MD. Schedule an appointment as soon as possible for a visit in 2 weeks.   Specialty:  Orthopedic Surgery Contact information: [address] [place] [address] [phone]           Discharge Instructions    Call MD / Call 911   Complete by:  As directed    If you experience chest pain or shortness of breath, CALL 911 and be transported to the hospital emergency room.  If you develope a fever above 101 F, pus (white drainage) or increased drainage or redness at the wound,  or calf pain, call your surgeon's office.   Change dressing   Complete by:  As directed    Maintain surgical dressing until follow up in the clinic. If the edges start to pull up, may reinforce with tape. If the dressing is no longer working, may remove and cover with gauze and tape, but must keep the area dry and clean.  Call with any questions or concerns.   Constipation Prevention   Complete by:  As directed    Drink plenty of fluids.  Prune juice may be helpful.  You may use a stool softener, such as Colace (over the counter) 100 mg twice a day.  Use MiraLax (over the counter) for constipation as needed.   Diet - low sodium heart healthy   Complete by:  As directed    Discharge instructions   Complete by:  As directed    Maintain surgical dressing until follow up in the clinic. If the edges start to pull up, may reinforce with tape. If the dressing is no longer working, may remove and cover with gauze and tape, but must keep the area dry and clean.  Follow up in 2 weeks at [facility]. Call with any questions or concerns.   Increase activity slowly as tolerated   Complete by:  As directed    Weight bearing as tolerated with assist device (walker, cane, etc) as directed, use it as long as suggested by your surgeon or therapist, typically at least 4-6 weeks.   TED hose   Complete by:  As directed    Use stockings (TED hose) for 2 weeks on both leg(s).  You may remove them at night for sleeping.      Allergies as of 11/30/2017   No Known Allergies     Medication List    STOP taking these medications   aspirin EC 81 MG tablet Replaced by:  aspirin 81 MG chewable tablet   ferrous sulfate 325 (65 FE) MG EC tablet Replaced by:  ferrous sulfate 325 (65 FE) MG tablet     TAKE these medications   amLODipine 5 MG tablet Commonly known as:  NORVASC Take 5 mg by mouth daily.   aspirin 81 MG chewable tablet Commonly known as:  ASPIRIN CHILDRENS Chew 1 tablet (81 mg total)  by mouth 2 (two) times daily. Take for 4 weeks, then resume regular dose. Replaces:  aspirin EC 81 MG tablet   atorvastatin 40 MG tablet Commonly known as:  LIPITOR Take 40 mg by mouth daily.   docusate sodium 100 MG capsule Commonly known as:  COLACE Take 1 capsule (100 mg total) by mouth 2 (two) times daily.   ferrous sulfate 325 (65 FE) MG tablet Commonly known as:  FERROUSUL Take 1 tablet (325 mg total) by mouth 3 (three)  times daily with meals. Replaces:  ferrous sulfate 325 (65 FE) MG EC tablet   furosemide 40 MG tablet Commonly known as:  LASIX Take 40 mg by mouth daily.   HYDROcodone-acetaminophen 7.5-325 MG tablet Commonly known as:  NORCO Take 1-2 tablets by mouth every 4 (four) hours as needed for moderate pain.   levETIRAcetam 500 MG tablet Commonly known as:  KEPPRA Take 500 mg by mouth 2 (two) times daily.   levothyroxine 50 MCG tablet Commonly known as:  SYNTHROID, LEVOTHROID Take 50 mcg by mouth daily before breakfast.   methocarbamol 500 MG tablet Commonly known as:  ROBAXIN Take 1 tablet (500 mg total) by mouth every 6 (six) hours as needed for muscle spasms.   omeprazole 20 MG tablet Commonly known as:  PRILOSEC OTC Take 20-40 mg by mouth daily as needed (acid reflux).   polyethylene glycol packet Commonly known as:  MIRALAX / GLYCOLAX Take 17 g by mouth 2 (two) times daily.   rOPINIRole 0.5 MG tablet Commonly known as:  REQUIP Take 0.5 mg by mouth 2 (two) times daily.   Vitamin D 2000 units tablet Take 2,000 Units by mouth daily.            Discharge Care Instructions  (From admission, onward)        Start     Ordered   11/30/17 0000  Change dressing    Comments:  Maintain surgical dressing until follow up in the clinic. If the edges start to pull up, may reinforce with tape. If the dressing is no longer working, may remove and cover with gauze and tape, but must keep the area dry and clean.  Call with any questions or concerns.    11/30/17 [id number]       Signed: Anastasio AuerbachMatthew S. Gizel Riedlinger   PA-C  12/05/2017, 9:24 AM

## 2017-12-07 DIAGNOSIS — M25561 Pain in right knee: Secondary | ICD-10-CM | POA: Diagnosis not present

## 2017-12-09 DIAGNOSIS — M25561 Pain in right knee: Secondary | ICD-10-CM | POA: Diagnosis not present

## 2017-12-12 DIAGNOSIS — M25561 Pain in right knee: Secondary | ICD-10-CM | POA: Diagnosis not present

## 2017-12-14 DIAGNOSIS — M25561 Pain in right knee: Secondary | ICD-10-CM | POA: Diagnosis not present

## 2017-12-16 DIAGNOSIS — M25561 Pain in right knee: Secondary | ICD-10-CM | POA: Diagnosis not present

## 2017-12-19 DIAGNOSIS — M25561 Pain in right knee: Secondary | ICD-10-CM | POA: Diagnosis not present

## 2017-12-21 DIAGNOSIS — M25561 Pain in right knee: Secondary | ICD-10-CM | POA: Diagnosis not present

## 2017-12-26 DIAGNOSIS — M25561 Pain in right knee: Secondary | ICD-10-CM | POA: Diagnosis not present

## 2017-12-28 DIAGNOSIS — M25561 Pain in right knee: Secondary | ICD-10-CM | POA: Diagnosis not present

## 2017-12-30 DIAGNOSIS — M25561 Pain in right knee: Secondary | ICD-10-CM | POA: Diagnosis not present

## 2018-01-02 DIAGNOSIS — M25561 Pain in right knee: Secondary | ICD-10-CM | POA: Diagnosis not present

## 2018-01-04 DIAGNOSIS — M25561 Pain in right knee: Secondary | ICD-10-CM | POA: Diagnosis not present

## 2018-01-06 DIAGNOSIS — M25561 Pain in right knee: Secondary | ICD-10-CM | POA: Diagnosis not present

## 2018-01-09 DIAGNOSIS — M25561 Pain in right knee: Secondary | ICD-10-CM | POA: Diagnosis not present

## 2018-01-11 DIAGNOSIS — M25561 Pain in right knee: Secondary | ICD-10-CM | POA: Diagnosis not present

## 2018-01-13 DIAGNOSIS — Z96651 Presence of right artificial knee joint: Secondary | ICD-10-CM | POA: Diagnosis not present

## 2018-01-13 DIAGNOSIS — M25561 Pain in right knee: Secondary | ICD-10-CM | POA: Diagnosis not present

## 2018-01-13 DIAGNOSIS — Z471 Aftercare following joint replacement surgery: Secondary | ICD-10-CM | POA: Diagnosis not present

## 2018-02-22 ENCOUNTER — Encounter: Payer: Self-pay | Admitting: Neurology

## 2018-02-22 ENCOUNTER — Ambulatory Visit (INDEPENDENT_AMBULATORY_CARE_PROVIDER_SITE_OTHER): Payer: Medicare Other | Admitting: Neurology

## 2018-02-22 VITALS — BP 144/75 | HR 65 | Ht 62.0 in | Wt 194.0 lb

## 2018-02-22 DIAGNOSIS — G40209 Localization-related (focal) (partial) symptomatic epilepsy and epileptic syndromes with complex partial seizures, not intractable, without status epilepticus: Secondary | ICD-10-CM | POA: Insufficient documentation

## 2018-02-22 MED ORDER — LAMOTRIGINE 100 MG PO TABS: 100.0000 mg | ORAL_TABLET | Freq: Two times a day (BID) | ORAL | 11 refills | Status: DC

## 2018-02-22 MED ORDER — LAMOTRIGINE 25 MG PO TABS: ORAL_TABLET | ORAL | 0 refills | Status: DC
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
## 2018-02-22 NOTE — Patient Instructions (Signed)
Week Lamotrigine Keppra   1st week 25mg  twice a day 500mg  twice a day  2nd week 25mg x2 tab twice aday 500mg  twice a day  3rd week 25mg x3tab twice aday 500mg  once every night  4th week 25mg x3 tab twice aday Stop Keppra    100mg  twice a day

## 2018-03-07 ENCOUNTER — Other Ambulatory Visit: Payer: Self-pay | Admitting: *Deleted

## 2018-03-07 ENCOUNTER — Other Ambulatory Visit: Payer: Self-pay | Admitting: Neurology

## 2018-03-07 ENCOUNTER — Ambulatory Visit (INDEPENDENT_AMBULATORY_CARE_PROVIDER_SITE_OTHER): Payer: Medicare Other | Admitting: Neurology

## 2018-03-07 DIAGNOSIS — G40209 Localization-related (focal) (partial) symptomatic epilepsy and epileptic syndromes with complex partial seizures, not intractable, without status epilepticus: Secondary | ICD-10-CM | POA: Diagnosis not present

## 2018-03-07 DIAGNOSIS — R569 Unspecified convulsions: Secondary | ICD-10-CM

## 2018-03-07 MED ORDER — LEVETIRACETAM 500 MG PO TABS: 500.0000 mg | ORAL_TABLET | Freq: Two times a day (BID) | ORAL | 5 refills | Status: DC

## 2018-03-07 NOTE — Progress Notes (Signed)
Patient developed rash taking lamotrigine, 25 mg 2 tablets twice a day, stop lamotrigine today, check CBC with differentiation, CMP,  Restart Keppra 500 mg twice daily

## 2018-03-08 ENCOUNTER — Telehealth: Payer: Self-pay | Admitting: Neurology

## 2018-03-08 LAB — COMPREHENSIVE METABOLIC PANEL
A/G RATIO: 1.4 (ref 1.2–2.2)
ALBUMIN: 4.2 g/dL (ref 3.5–4.8)
ALT: 22 IU/L (ref 0–32)
AST: 22 IU/L (ref 0–40)
Alkaline Phosphatase: 115 IU/L (ref 39–117)
BILIRUBIN TOTAL: 0.5 mg/dL (ref 0.0–1.2)
BUN / CREAT RATIO: 15 (ref 12–28)
BUN: 18 mg/dL (ref 8–27)
CALCIUM: 9.5 mg/dL (ref 8.7–10.3)
CO2: 23 mmol/L (ref 20–29)
Chloride: 102 mmol/L (ref 96–106)
Creatinine, Ser: 1.23 mg/dL — ABNORMAL HIGH (ref 0.57–1.00)
GFR calc non Af Amer: 43 mL/min/{1.73_m2} — ABNORMAL LOW (ref >59)
GFR, EST AFRICAN AMERICAN: 49 mL/min/{1.73_m2} — AB (ref >59)
GLOBULIN, TOTAL: 2.9 g/dL (ref 1.5–4.5)
Glucose: 84 mg/dL (ref 65–99)
POTASSIUM: 5.3 mmol/L — AB (ref 3.5–5.2)
SODIUM: 141 mmol/L (ref 134–144)
TOTAL PROTEIN: 7.1 g/dL (ref 6.0–8.5)

## 2018-03-08 LAB — CBC WITH DIFFERENTIAL/PLATELET
BASOS: 1 %
Basophils Absolute: 0 10*3/uL (ref 0.0–0.2)
EOS (ABSOLUTE): 0.7 10*3/uL — AB (ref 0.0–0.4)
EOS: 10 %
HEMATOCRIT: 38 % (ref 34.0–46.6)
Hemoglobin: 12.3 g/dL (ref 11.1–15.9)
IMMATURE GRANS (ABS): 0 10*3/uL (ref 0.0–0.1)
IMMATURE GRANULOCYTES: 0 %
LYMPHS: 22 %
Lymphocytes Absolute: 1.5 10*3/uL (ref 0.7–3.1)
MCH: 29.6 pg (ref 26.6–33.0)
MCHC: 32.4 g/dL (ref 31.5–35.7)
MCV: 91 fL (ref 79–97)
Monocytes Absolute: 0.8 10*3/uL (ref 0.1–0.9)
Monocytes: 12 %
NEUTROS ABS: 3.6 10*3/uL (ref 1.4–7.0)
NEUTROS PCT: 55 %
PLATELETS: 238 10*3/uL (ref 150–450)
RBC: 4.16 x10E6/uL (ref 3.77–5.28)
RDW: 14 % (ref 12.3–15.4)
WBC: 6.5 10*3/uL (ref 3.4–10.8)

## 2018-03-08 NOTE — Telephone Encounter (Signed)
Spoke to patient - she is aware of results.  She has increased her water intake and using Aveeno oatmeal bath wash.  States her rash looks better today.  Her daughter is going to bring her by our office early next week for a quick check to make sure she is improving.  She verbalized understanding to proceed to the ED, if it worsens over the holiday weekend.

## 2018-03-08 NOTE — Telephone Encounter (Signed)
Please call patient, laboratory evaluation showed mild abnormal kidney function creatinine 1.23, GFR of 43, which is about her baseline, advised her to increase water intake, CBC showed elevated eosinophilia, consistent with allergic reaction,  Make sure her rash is improving slowly after stopping lamotrigine, if not, she needs to contact us, or go to emergency room

## 2018-03-15 NOTE — Procedures (Signed)
   HISTORY: 77 year old female, had a history of seizure,  TECHNIQUE:  16 channel EEG was performed based on standard 10-16 international system. One channel was dedicated to EKG, which has demonstrates normal sinus rhythm of 90 beats per minutes.  Upon awakening, the posterior background activity was mildly dysrhythmic, low amplitude, with mild slowing, 6 to 7 Hz, reactive to eye opening and closure.  There was no evidence of epileptiform discharge.  Photic stimulation was performed, which induced a rhythmic symmetric photic driving.  Hyperventilation was performed, there was no abnormality elicit.  No sleep was achieved.  CONCLUSION: This is a mild abnormal EEG.  There is evidence of generalized slowing, common etiology are metabolic toxic.  There is no evidence of epileptiform discharges.  Levert FeinsteinYijun Kelee Cunningham, M.D. Ph.D.  [facility] [address] [place], [place] [address] Phone: [phone] Fax:      [phone]

## 2018-04-06 ENCOUNTER — Ambulatory Visit: Payer: Medicare Other | Admitting: Neurology

## 2018-04-10 ENCOUNTER — Encounter: Payer: Self-pay | Admitting: Neurology

## 2018-04-10 ENCOUNTER — Ambulatory Visit (INDEPENDENT_AMBULATORY_CARE_PROVIDER_SITE_OTHER): Payer: Medicare Other | Admitting: Neurology

## 2018-04-10 VITALS — BP 152/81 | HR 81 | Ht 62.0 in | Wt 190.2 lb

## 2018-04-10 DIAGNOSIS — G40209 Localization-related (focal) (partial) symptomatic epilepsy and epileptic syndromes with complex partial seizures, not intractable, without status epilepticus: Secondary | ICD-10-CM

## 2018-04-10 MED ORDER — LEVETIRACETAM 500 MG PO TABS: 500.0000 mg | ORAL_TABLET | Freq: Two times a day (BID) | ORAL | 4 refills | Status: DC

## 2018-04-10 NOTE — Telephone Encounter (Signed)
Late entry:  Patient daughter did bring patient back to our office the following week to have her rash rechecked.  It had greatly improved.
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
## 2018-04-11 ENCOUNTER — Telehealth: Payer: Self-pay | Admitting: Neurology

## 2018-04-11 NOTE — Telephone Encounter (Signed)
Pt requesting a call, stating she is needing to know if she is cleared to drive.

## 2018-04-11 NOTE — Telephone Encounter (Signed)
Her last reported episode occurred on 02/16/18.  Per [place] law, she is unable to drive until she is six months event free.  I spoke to the patient and she verbalized understanding.  She stated she would not drive.

## 2018-06-05 ENCOUNTER — Ambulatory Visit: Payer: Medicare Other | Admitting: Neurology

## 2018-07-02 DIAGNOSIS — Z23 Encounter for immunization: Secondary | ICD-10-CM | POA: Diagnosis not present

## 2018-09-19 ENCOUNTER — Other Ambulatory Visit: Payer: Self-pay | Admitting: Neurology

## 2018-09-19 DIAGNOSIS — E782 Mixed hyperlipidemia: Secondary | ICD-10-CM | POA: Diagnosis not present

## 2018-09-19 DIAGNOSIS — N183 Chronic kidney disease, stage 3 (moderate): Secondary | ICD-10-CM | POA: Diagnosis not present

## 2018-09-19 DIAGNOSIS — Z0000 Encounter for general adult medical examination without abnormal findings: Secondary | ICD-10-CM | POA: Diagnosis not present

## 2018-09-19 DIAGNOSIS — G40909 Epilepsy, unspecified, not intractable, without status epilepticus: Secondary | ICD-10-CM | POA: Diagnosis not present

## 2018-09-19 DIAGNOSIS — Z1211 Encounter for screening for malignant neoplasm of colon: Secondary | ICD-10-CM | POA: Diagnosis not present

## 2018-09-19 DIAGNOSIS — D509 Iron deficiency anemia, unspecified: Secondary | ICD-10-CM | POA: Diagnosis not present

## 2018-09-19 DIAGNOSIS — G2581 Restless legs syndrome: Secondary | ICD-10-CM | POA: Diagnosis not present

## 2018-09-19 DIAGNOSIS — M810 Age-related osteoporosis without current pathological fracture: Secondary | ICD-10-CM | POA: Diagnosis not present

## 2018-09-19 DIAGNOSIS — R413 Other amnesia: Secondary | ICD-10-CM | POA: Diagnosis not present

## 2018-09-19 DIAGNOSIS — I10 Essential (primary) hypertension: Secondary | ICD-10-CM | POA: Diagnosis not present

## 2018-09-19 DIAGNOSIS — E039 Hypothyroidism, unspecified: Secondary | ICD-10-CM | POA: Diagnosis not present

## 2018-09-19 NOTE — Telephone Encounter (Signed)
Pt is requesting at least a months worth of her levETIRAcetam (KEPPRA) 500 MG tablet BID at [facility] on [address] Pt states she will run out on Thurs. Please advise.

## 2018-09-19 NOTE — Telephone Encounter (Signed)
90 day rx. with 1 yr. r/f's escribed to [facility] in Aug. 2019. I have confirmed with [facility] that they have r/f's on file. I spoke with pt. and let her know r[facility]f's are available at [facility]

## 2018-10-16 ENCOUNTER — Ambulatory Visit: Payer: Medicare Other | Admitting: Nurse Practitioner

## 2018-10-18 ENCOUNTER — Encounter: Payer: Self-pay | Admitting: Neurology

## 2018-10-18 ENCOUNTER — Ambulatory Visit (INDEPENDENT_AMBULATORY_CARE_PROVIDER_SITE_OTHER): Payer: Medicare Other | Admitting: Neurology

## 2018-10-18 VITALS — BP 135/77 | HR 77 | Ht 62.0 in | Wt 181.0 lb

## 2018-10-18 DIAGNOSIS — G40209 Localization-related (focal) (partial) symptomatic epilepsy and epileptic syndromes with complex partial seizures, not intractable, without status epilepticus: Secondary | ICD-10-CM
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
## 2018-10-19 NOTE — Progress Notes (Signed)
I have reviewed and agreed above plan. 

## 2018-12-16 DIAGNOSIS — I10 Essential (primary) hypertension: Secondary | ICD-10-CM | POA: Diagnosis not present

## 2018-12-16 DIAGNOSIS — R569 Unspecified convulsions: Secondary | ICD-10-CM | POA: Diagnosis not present

## 2018-12-18 ENCOUNTER — Ambulatory Visit (INDEPENDENT_AMBULATORY_CARE_PROVIDER_SITE_OTHER): Payer: Medicare Other | Admitting: Neurology

## 2018-12-18 ENCOUNTER — Telehealth: Payer: Self-pay | Admitting: Neurology

## 2018-12-18 ENCOUNTER — Other Ambulatory Visit: Payer: Self-pay

## 2018-12-18 ENCOUNTER — Encounter: Payer: Self-pay | Admitting: Neurology

## 2018-12-18 DIAGNOSIS — G40209 Localization-related (focal) (partial) symptomatic epilepsy and epileptic syndromes with complex partial seizures, not intractable, without status epilepticus: Secondary | ICD-10-CM

## 2018-12-18 NOTE — Telephone Encounter (Signed)
I spoke to the patient and her daughter this morning.  Her daughter witnessed a seizure-like event on 12/16/2018.  She found her mother "spaced out" with confusion (did not recognize her own daughter).  These symptoms lasted approximately six minutes before the patient returned back to her baseline.  EMT was called.  When they arrived, the patient was able to answer their questions appropriately with normal vital signs and normal EKG.  She takes Keppra 500mg , one tablet BID and denies missing any doses.  She has consented to having a virtual visit today with Robin Ege, NP.
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
## 2018-12-19 NOTE — Progress Notes (Signed)
I have reviewed and agreed above plan. 
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
## 2019-04-18 ENCOUNTER — Other Ambulatory Visit: Payer: Self-pay

## 2019-04-18 ENCOUNTER — Encounter: Payer: Self-pay | Admitting: Neurology

## 2019-04-18 ENCOUNTER — Ambulatory Visit (INDEPENDENT_AMBULATORY_CARE_PROVIDER_SITE_OTHER): Payer: Medicare Other | Admitting: Neurology

## 2019-04-18 VITALS — BP 151/77 | HR 72 | Temp 96.8°F | Ht 62.0 in | Wt 189.2 lb

## 2019-04-18 DIAGNOSIS — G40209 Localization-related (focal) (partial) symptomatic epilepsy and epileptic syndromes with complex partial seizures, not intractable, without status epilepticus: Secondary | ICD-10-CM

## 2019-04-18 MED ORDER — LEVETIRACETAM 750 MG PO TABS: 750.0000 mg | ORAL_TABLET | Freq: Two times a day (BID) | ORAL | 5 refills | Status: DC

## 2019-04-18 NOTE — Addendum Note (Signed)
Addended by: Marcial Pacas on: 04/18/2019 05:30 PM   Modules accepted: Orders

## 2019-04-18 NOTE — Progress Notes (Signed)
May consider cardiology work up, refer her to 30 days cardiac monitoring

## 2019-04-18 NOTE — Patient Instructions (Signed)
Increase your Keppra 750 mg twice daily to see if this prevents episodes. We will see you back in 6 months.

## 2019-04-19 ENCOUNTER — Telehealth: Payer: Self-pay | Admitting: Neurology

## 2019-04-19 NOTE — Telephone Encounter (Signed)
Please call patient and let her know that we will order 30 day cardiac monitor study givien her continuation of events to rule out arrhythmia.

## 2019-04-23 NOTE — Telephone Encounter (Signed)
I called pt and spoke to her about recommendation for cardiac event monitor.  She is amenable to this.  I relayed will send to referrals to make sure order was received.  Pt stated may LM on VM if does not pick up. Daughter or grandson to drive pt.

## 2019-04-24 ENCOUNTER — Telehealth: Payer: Self-pay | Admitting: *Deleted

## 2019-04-24 NOTE — Telephone Encounter (Signed)
[employer] to ship a 30 day cardiac event monitor to her home.  Instructions reviewed briefly as they are included in the monitor kit. 

## 2019-04-27 ENCOUNTER — Encounter (INDEPENDENT_AMBULATORY_CARE_PROVIDER_SITE_OTHER): Payer: Medicare Other

## 2019-04-27 DIAGNOSIS — G40209 Localization-related (focal) (partial) symptomatic epilepsy and epileptic syndromes with complex partial seizures, not intractable, without status epilepticus: Secondary | ICD-10-CM | POA: Diagnosis not present

## 2019-04-27 DIAGNOSIS — R404 Transient alteration of awareness: Secondary | ICD-10-CM | POA: Diagnosis not present

## 2019-04-27 DIAGNOSIS — R42 Dizziness and giddiness: Secondary | ICD-10-CM | POA: Diagnosis not present

## 2019-05-03 NOTE — Telephone Encounter (Signed)
Called and and talked to Winger and she will send patient a cardiac monitor

## 2019-05-23 ENCOUNTER — Other Ambulatory Visit: Payer: Self-pay | Admitting: Neurology

## 2019-05-28 ENCOUNTER — Telehealth: Payer: Self-pay

## 2019-05-28 NOTE — Telephone Encounter (Signed)
Pt called about her medication. She wants her seizure med refilled. Also to please have NP Grandville Silos to contact her back tomorrow.

## 2019-05-29 MED ORDER — LEVETIRACETAM 750 MG PO TABS: 750.0000 mg | ORAL_TABLET | Freq: Two times a day (BID) | ORAL | 5 refills | Status: DC

## 2019-05-29 NOTE — Telephone Encounter (Signed)
I called pt [facility] is not her pharmacy.  She uses [facility] at [address].  Redid prescription for her.

## 2019-05-29 NOTE — Addendum Note (Signed)
Addended by: Brandon Melnick on: 05/29/2019 12:07 PM   Modules accepted: Orders

## 2019-06-14 ENCOUNTER — Other Ambulatory Visit: Payer: Self-pay | Admitting: Neurology

## 2019-06-14 DIAGNOSIS — G40209 Localization-related (focal) (partial) symptomatic epilepsy and epileptic syndromes with complex partial seizures, not intractable, without status epilepticus: Secondary | ICD-10-CM

## 2019-06-14 DIAGNOSIS — R42 Dizziness and giddiness: Secondary | ICD-10-CM

## 2019-06-14 DIAGNOSIS — R404 Transient alteration of awareness: Secondary | ICD-10-CM

## 2019-06-20 ENCOUNTER — Telehealth: Payer: Self-pay | Admitting: *Deleted

## 2019-06-20 NOTE — Telephone Encounter (Signed)
-----   Message from Marcial Pacas, MD sent at 06/20/2019  2:20 PM EDT ----- Please call patient for normal cardiac event monitoring

## 2019-06-20 NOTE — Telephone Encounter (Signed)
I was able to speak to the patient and she verbalized understanding of her results. 

## 2019-07-18 DIAGNOSIS — Z23 Encounter for immunization: Secondary | ICD-10-CM | POA: Diagnosis not present

## 2019-09-25 DIAGNOSIS — E782 Mixed hyperlipidemia: Secondary | ICD-10-CM | POA: Diagnosis not present

## 2019-09-25 DIAGNOSIS — E039 Hypothyroidism, unspecified: Secondary | ICD-10-CM | POA: Diagnosis not present

## 2019-09-25 DIAGNOSIS — M810 Age-related osteoporosis without current pathological fracture: Secondary | ICD-10-CM | POA: Diagnosis not present

## 2019-09-25 DIAGNOSIS — G2581 Restless legs syndrome: Secondary | ICD-10-CM | POA: Diagnosis not present

## 2019-09-25 DIAGNOSIS — Z0000 Encounter for general adult medical examination without abnormal findings: Secondary | ICD-10-CM | POA: Diagnosis not present

## 2019-09-25 DIAGNOSIS — N1832 Chronic kidney disease, stage 3b: Secondary | ICD-10-CM | POA: Diagnosis not present

## 2019-09-25 DIAGNOSIS — I10 Essential (primary) hypertension: Secondary | ICD-10-CM | POA: Diagnosis not present

## 2019-10-05 DIAGNOSIS — I10 Essential (primary) hypertension: Secondary | ICD-10-CM | POA: Diagnosis not present

## 2019-10-05 DIAGNOSIS — E782 Mixed hyperlipidemia: Secondary | ICD-10-CM | POA: Diagnosis not present

## 2019-10-05 DIAGNOSIS — G2581 Restless legs syndrome: Secondary | ICD-10-CM | POA: Diagnosis not present

## 2019-10-05 DIAGNOSIS — Z0000 Encounter for general adult medical examination without abnormal findings: Secondary | ICD-10-CM | POA: Diagnosis not present

## 2019-10-05 DIAGNOSIS — E039 Hypothyroidism, unspecified: Secondary | ICD-10-CM | POA: Diagnosis not present

## 2019-10-05 DIAGNOSIS — M810 Age-related osteoporosis without current pathological fracture: Secondary | ICD-10-CM | POA: Diagnosis not present

## 2019-10-05 DIAGNOSIS — N1832 Chronic kidney disease, stage 3b: Secondary | ICD-10-CM | POA: Diagnosis not present

## 2019-10-23 ENCOUNTER — Ambulatory Visit: Payer: Medicare Other | Admitting: Neurology

## 2019-11-01 ENCOUNTER — Ambulatory Visit: Payer: Medicare Other | Admitting: Neurology

## 2020-01-01 ENCOUNTER — Ambulatory Visit: Payer: Medicare HMO | Admitting: Neurology

## 2020-01-01 ENCOUNTER — Encounter: Payer: Self-pay | Admitting: Neurology

## 2020-01-01 ENCOUNTER — Other Ambulatory Visit: Payer: Self-pay

## 2020-01-01 VITALS — BP 162/80 | HR 80 | Ht 64.0 in | Wt 191.0 lb

## 2020-01-01 DIAGNOSIS — R799 Abnormal finding of blood chemistry, unspecified: Secondary | ICD-10-CM | POA: Diagnosis not present

## 2020-01-01 DIAGNOSIS — R413 Other amnesia: Secondary | ICD-10-CM | POA: Diagnosis not present

## 2020-01-01 DIAGNOSIS — G3281 Cerebellar ataxia in diseases classified elsewhere: Secondary | ICD-10-CM

## 2020-01-01 DIAGNOSIS — R269 Unspecified abnormalities of gait and mobility: Secondary | ICD-10-CM | POA: Diagnosis not present

## 2020-01-01 DIAGNOSIS — G40209 Localization-related (focal) (partial) symptomatic epilepsy and epileptic syndromes with complex partial seizures, not intractable, without status epilepticus: Secondary | ICD-10-CM

## 2020-01-01 DIAGNOSIS — R946 Abnormal results of thyroid function studies: Secondary | ICD-10-CM | POA: Diagnosis not present

## 2020-01-01 DIAGNOSIS — D518 Other vitamin B12 deficiency anemias: Secondary | ICD-10-CM | POA: Diagnosis not present

## 2020-01-01 DIAGNOSIS — G2581 Restless legs syndrome: Secondary | ICD-10-CM | POA: Diagnosis not present

## 2020-01-01 DIAGNOSIS — R790 Abnormal level of blood mineral: Secondary | ICD-10-CM | POA: Diagnosis not present

## 2020-01-01 MED ORDER — GABAPENTIN 100 MG PO CAPS: 300.0000 mg | ORAL_CAPSULE | Freq: Every day | ORAL | 11 refills | Status: DC

## 2020-01-01 MED ORDER — MEMANTINE HCL 10 MG PO TABS: 10.0000 mg | ORAL_TABLET | Freq: Two times a day (BID) | ORAL | 11 refills | Status: DC
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
## 2020-01-02 ENCOUNTER — Telehealth: Payer: Self-pay | Admitting: Neurology

## 2020-01-02 ENCOUNTER — Encounter: Payer: Self-pay | Admitting: Neurology

## 2020-01-02 LAB — COMPREHENSIVE METABOLIC PANEL
ALT: 24 IU/L (ref 0–32)
AST: 28 IU/L (ref 0–40)
Albumin/Globulin Ratio: 1.4 (ref 1.2–2.2)
Albumin: 3.9 g/dL (ref 3.7–4.7)
Alkaline Phosphatase: 92 IU/L (ref 39–117)
BUN/Creatinine Ratio: 19 (ref 12–28)
BUN: 19 mg/dL (ref 8–27)
Bilirubin Total: 0.6 mg/dL (ref 0.0–1.2)
CO2: 25 mmol/L (ref 20–29)
Calcium: 9.2 mg/dL (ref 8.7–10.3)
Chloride: 103 mmol/L (ref 96–106)
Creatinine, Ser: 1.02 mg/dL — ABNORMAL HIGH (ref 0.57–1.00)
GFR calc Af Amer: 61 mL/min/{1.73_m2} (ref >59)
GFR calc non Af Amer: 53 mL/min/{1.73_m2} — ABNORMAL LOW (ref >59)
Globulin, Total: 2.8 g/dL (ref 1.5–4.5)
Glucose: 82 mg/dL (ref 65–99)
Potassium: 4.5 mmol/L (ref 3.5–5.2)
Sodium: 141 mmol/L (ref 134–144)
Total Protein: 6.7 g/dL (ref 6.0–8.5)

## 2020-01-02 LAB — CBC WITH DIFFERENTIAL/PLATELET
Basophils Absolute: 0.1 10*3/uL (ref 0.0–0.2)
Basos: 1 %
EOS (ABSOLUTE): 0.3 10*3/uL (ref 0.0–0.4)
Eos: 4 %
Hematocrit: 38.8 % (ref 34.0–46.6)
Hemoglobin: 12.6 g/dL (ref 11.1–15.9)
Immature Grans (Abs): 0 10*3/uL (ref 0.0–0.1)
Immature Granulocytes: 0 %
Lymphocytes Absolute: 2.1 10*3/uL (ref 0.7–3.1)
Lymphs: 28 %
MCH: 30.1 pg (ref 26.6–33.0)
MCHC: 32.5 g/dL (ref 31.5–35.7)
MCV: 93 fL (ref 79–97)
Monocytes Absolute: 0.8 10*3/uL (ref 0.1–0.9)
Monocytes: 11 %
Neutrophils Absolute: 4.2 10*3/uL (ref 1.4–7.0)
Neutrophils: 56 %
Platelets: 275 10*3/uL (ref 150–450)
RBC: 4.18 x10E6/uL (ref 3.77–5.28)
RDW: 12.9 % (ref 11.7–15.4)
WBC: 7.5 10*3/uL (ref 3.4–10.8)

## 2020-01-02 LAB — C-REACTIVE PROTEIN: CRP: <1 mg/L (ref 0–10)

## 2020-01-02 LAB — HIV ANTIBODY (ROUTINE TESTING W REFLEX): HIV Screen 4th Generation wRfx: NONREACTIVE

## 2020-01-02 LAB — VITAMIN B12: Vitamin B-12: 337 pg/mL (ref 232–1245)

## 2020-01-02 LAB — FERRITIN: Ferritin: 60 ng/mL (ref 15–150)

## 2020-01-02 LAB — TSH: TSH: 4.68 u[IU]/mL — ABNORMAL HIGH (ref 0.450–4.500)

## 2020-01-02 LAB — RPR: RPR Ser Ql: NONREACTIVE

## 2020-01-02 NOTE — Telephone Encounter (Signed)
I have tried the patient's home number with no answer or voicemail. I have called her daughter's number and it rings busy.

## 2020-01-02 NOTE — Telephone Encounter (Signed)
Please call patient, laboratory evaluation showed mild abnormal creatinine, with slight decreased GFR of 53, she would benefit increased water intake  Mild elevated TSH, could indicate suboptimal as application of thyroid, I have faxed the laboratory results to her primary care Soundra Pilon, FNP, she may continue follow-up with her primary care physician.

## 2020-01-02 NOTE — Telephone Encounter (Signed)
Her daughter is listed as her emergency contact and on her DPR. She has passed away. However, the only number listed in her chart is the daughter's number. The patient's son-in-law answered the phone and updated me with this information. He is going to ask the patient to call our office back.  When we receive a call back, we need to ask for updated numbers and emergency contacts.  I have put a note under her next appt to obtain an updated DPR.

## 2020-01-03 ENCOUNTER — Telehealth: Payer: Self-pay | Admitting: Neurology

## 2020-01-03 NOTE — Telephone Encounter (Signed)
[place] pending faxed notes

## 2020-01-03 NOTE — Telephone Encounter (Addendum)
I have also called [facility]. They both have her daughter's home number listed. The patient no longer lives there.  I called her PCP office who had her updated phone number. I was able to reach the patient and provide her with the lab results below. She will follow up with her PCP.  I also updated her address, phone number and emergency contacts.

## 2020-01-07 ENCOUNTER — Telehealth: Payer: Self-pay | Admitting: Neurology

## 2020-01-07 NOTE — Telephone Encounter (Signed)
Pt called and LVM wanting to discuss her gabapentin (NEURONTIN) 100 MG capsule with the RN. Pt is having side effects and would like to be advised.

## 2020-01-07 NOTE — Telephone Encounter (Signed)
She has just started gabapentin 100mg , one capsule  at bedtime. She wanted to report that her leg jerking at night has not improved. Her prescription directs her to titrate up to gabapentin 100mg , three capsules QHS. She verbalized understanding to continuing titrating up her dosage with the expectation that her symptoms will improve.

## 2020-01-08 NOTE — Telephone Encounter (Signed)
I called [employer] spoke to nurse Clydie Braun to check the status. She stated she did not receive my fax. I faxed the notes again.

## 2020-01-09 NOTE — Telephone Encounter (Signed)
Robin Horton: [phone] (exp. 01/03/20  to 02/02/20) order sent to GI. They will reach out to the patient to schedule.

## 2020-01-20 ENCOUNTER — Other Ambulatory Visit: Payer: PRIVATE HEALTH INSURANCE

## 2020-01-23 ENCOUNTER — Other Ambulatory Visit: Payer: Self-pay | Admitting: Neurology

## 2020-03-11 DIAGNOSIS — Z0100 Encounter for examination of eyes and vision without abnormal findings: Secondary | ICD-10-CM | POA: Diagnosis not present

## 2020-03-11 DIAGNOSIS — H40013 Open angle with borderline findings, low risk, bilateral: Secondary | ICD-10-CM | POA: Diagnosis not present

## 2020-04-29 DIAGNOSIS — G2581 Restless legs syndrome: Secondary | ICD-10-CM | POA: Diagnosis not present

## 2020-04-29 DIAGNOSIS — N1832 Chronic kidney disease, stage 3b: Secondary | ICD-10-CM | POA: Diagnosis not present

## 2020-04-29 DIAGNOSIS — M85851 Other specified disorders of bone density and structure, right thigh: Secondary | ICD-10-CM | POA: Diagnosis not present

## 2020-04-29 DIAGNOSIS — M810 Age-related osteoporosis without current pathological fracture: Secondary | ICD-10-CM | POA: Diagnosis not present

## 2020-04-29 DIAGNOSIS — E782 Mixed hyperlipidemia: Secondary | ICD-10-CM | POA: Diagnosis not present

## 2020-04-29 DIAGNOSIS — E039 Hypothyroidism, unspecified: Secondary | ICD-10-CM | POA: Diagnosis not present

## 2020-05-02 ENCOUNTER — Other Ambulatory Visit: Payer: Self-pay | Admitting: Family Medicine

## 2020-05-02 DIAGNOSIS — Z1231 Encounter for screening mammogram for malignant neoplasm of breast: Secondary | ICD-10-CM

## 2020-05-02 DIAGNOSIS — M810 Age-related osteoporosis without current pathological fracture: Secondary | ICD-10-CM

## 2020-05-05 ENCOUNTER — Ambulatory Visit
Admission: RE | Admit: 2020-05-05 | Discharge: 2020-05-05 | Disposition: A | Discharge status: Home / Self Care | Payer: PRIVATE HEALTH INSURANCE | Source: Ambulatory Visit | Attending: Family Medicine | Admitting: Family Medicine

## 2020-05-05 ENCOUNTER — Other Ambulatory Visit: Payer: Self-pay

## 2020-05-05 DIAGNOSIS — M810 Age-related osteoporosis without current pathological fracture: Secondary | ICD-10-CM

## 2020-05-05 DIAGNOSIS — Z780 Asymptomatic menopausal state: Secondary | ICD-10-CM | POA: Diagnosis not present

## 2020-05-05 DIAGNOSIS — M85851 Other specified disorders of bone density and structure, right thigh: Secondary | ICD-10-CM | POA: Diagnosis not present

## 2020-06-09 ENCOUNTER — Other Ambulatory Visit: Payer: Self-pay | Admitting: *Deleted

## 2020-06-09 MED ORDER — MEMANTINE HCL 10 MG PO TABS
10.0000 mg | ORAL_TABLET | Freq: Two times a day (BID) | ORAL | 3 refills | Status: DC
Start: 2020-06-09 — End: 2021-05-19

## 2020-07-03 ENCOUNTER — Ambulatory Visit: Payer: Medicare HMO | Admitting: Neurology

## 2020-07-03 ENCOUNTER — Other Ambulatory Visit: Payer: Self-pay

## 2020-07-03 ENCOUNTER — Encounter: Payer: Self-pay | Admitting: Neurology

## 2020-07-03 VITALS — BP 136/74 | HR 73 | Ht 64.0 in | Wt 199.0 lb

## 2020-07-03 DIAGNOSIS — G2581 Restless legs syndrome: Secondary | ICD-10-CM

## 2020-07-03 DIAGNOSIS — R413 Other amnesia: Secondary | ICD-10-CM

## 2020-07-03 DIAGNOSIS — G40209 Localization-related (focal) (partial) symptomatic epilepsy and epileptic syndromes with complex partial seizures, not intractable, without status epilepticus: Secondary | ICD-10-CM

## 2020-07-03 MED ORDER — LEVETIRACETAM 750 MG PO TABS: 750.0000 mg | ORAL_TABLET | Freq: Two times a day (BID) | ORAL | 4 refills | Status: DC
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
## 2020-07-03 NOTE — Patient Instructions (Signed)
Continue Keppra, Namenda  Memory looks good  Call for seizure activity  See you back in 1 year

## 2020-07-22 ENCOUNTER — Telehealth: Payer: Self-pay | Admitting: Neurology

## 2020-07-22 MED ORDER — GABAPENTIN 100 MG PO CAPS: 300.0000 mg | ORAL_CAPSULE | Freq: Every day | ORAL | 1 refills | Status: DC

## 2020-07-22 NOTE — Telephone Encounter (Signed)
Pt is asking to go back on gabapentin, she feels like she was taking with her memory medication or something she had eaten and was not able to tolerate.  But after thinking about it she went back on it and has been doing well.  Her pcp prescribed ropinirole and is asking [facility] not to fill.  Would like gabapentin to be done to [facility].  Please advise.  Gabapentin 300mg  po qhs. (100mg  caps)

## 2020-07-22 NOTE — Telephone Encounter (Signed)
Pt called been taking gabapentin for 2 1/2 weeks would like to stay on gabapentin.  This medication is working for me, legs not jerking, sleeping better. I have discontinued Ropinirole.

## 2020-09-06 HISTORY — PX: CATARACT EXTRACTION, BILATERAL: SHX1313

## 2020-11-07 DIAGNOSIS — I10 Essential (primary) hypertension: Secondary | ICD-10-CM | POA: Diagnosis not present

## 2020-11-07 DIAGNOSIS — N183 Chronic kidney disease, stage 3 unspecified: Secondary | ICD-10-CM | POA: Diagnosis not present

## 2020-11-07 DIAGNOSIS — Z1211 Encounter for screening for malignant neoplasm of colon: Secondary | ICD-10-CM | POA: Diagnosis not present

## 2020-11-07 DIAGNOSIS — Z0000 Encounter for general adult medical examination without abnormal findings: Secondary | ICD-10-CM | POA: Diagnosis not present

## 2020-11-07 DIAGNOSIS — K219 Gastro-esophageal reflux disease without esophagitis: Secondary | ICD-10-CM | POA: Diagnosis not present

## 2020-11-07 DIAGNOSIS — E039 Hypothyroidism, unspecified: Secondary | ICD-10-CM | POA: Diagnosis not present

## 2020-11-07 DIAGNOSIS — E782 Mixed hyperlipidemia: Secondary | ICD-10-CM | POA: Diagnosis not present

## 2021-01-02 DIAGNOSIS — M170 Bilateral primary osteoarthritis of knee: Secondary | ICD-10-CM | POA: Diagnosis not present

## 2021-01-02 DIAGNOSIS — E782 Mixed hyperlipidemia: Secondary | ICD-10-CM | POA: Diagnosis not present

## 2021-01-02 DIAGNOSIS — N183 Chronic kidney disease, stage 3 unspecified: Secondary | ICD-10-CM | POA: Diagnosis not present

## 2021-01-02 DIAGNOSIS — I10 Essential (primary) hypertension: Secondary | ICD-10-CM | POA: Diagnosis not present

## 2021-01-02 DIAGNOSIS — K219 Gastro-esophageal reflux disease without esophagitis: Secondary | ICD-10-CM | POA: Diagnosis not present

## 2021-01-02 DIAGNOSIS — D509 Iron deficiency anemia, unspecified: Secondary | ICD-10-CM | POA: Diagnosis not present

## 2021-01-02 DIAGNOSIS — E039 Hypothyroidism, unspecified: Secondary | ICD-10-CM | POA: Diagnosis not present

## 2021-01-29 DIAGNOSIS — Z135 Encounter for screening for eye and ear disorders: Secondary | ICD-10-CM | POA: Diagnosis not present

## 2021-01-29 DIAGNOSIS — H259 Unspecified age-related cataract: Secondary | ICD-10-CM | POA: Diagnosis not present

## 2021-01-30 ENCOUNTER — Other Ambulatory Visit: Payer: Self-pay | Admitting: Neurology

## 2021-02-10 DIAGNOSIS — Z01818 Encounter for other preprocedural examination: Secondary | ICD-10-CM | POA: Diagnosis not present

## 2021-02-10 DIAGNOSIS — H2512 Age-related nuclear cataract, left eye: Secondary | ICD-10-CM | POA: Diagnosis not present

## 2021-02-10 DIAGNOSIS — H2511 Age-related nuclear cataract, right eye: Secondary | ICD-10-CM | POA: Diagnosis not present

## 2021-02-25 DIAGNOSIS — E782 Mixed hyperlipidemia: Secondary | ICD-10-CM | POA: Diagnosis not present

## 2021-02-25 DIAGNOSIS — K219 Gastro-esophageal reflux disease without esophagitis: Secondary | ICD-10-CM | POA: Diagnosis not present

## 2021-02-25 DIAGNOSIS — I10 Essential (primary) hypertension: Secondary | ICD-10-CM | POA: Diagnosis not present

## 2021-02-25 DIAGNOSIS — M170 Bilateral primary osteoarthritis of knee: Secondary | ICD-10-CM | POA: Diagnosis not present

## 2021-02-25 DIAGNOSIS — N183 Chronic kidney disease, stage 3 unspecified: Secondary | ICD-10-CM | POA: Diagnosis not present

## 2021-02-25 DIAGNOSIS — D509 Iron deficiency anemia, unspecified: Secondary | ICD-10-CM | POA: Diagnosis not present

## 2021-02-25 DIAGNOSIS — E039 Hypothyroidism, unspecified: Secondary | ICD-10-CM | POA: Diagnosis not present

## 2021-02-26 DIAGNOSIS — H2512 Age-related nuclear cataract, left eye: Secondary | ICD-10-CM | POA: Diagnosis not present

## 2021-03-12 DIAGNOSIS — H25811 Combined forms of age-related cataract, right eye: Secondary | ICD-10-CM | POA: Diagnosis not present

## 2021-03-12 DIAGNOSIS — H2511 Age-related nuclear cataract, right eye: Secondary | ICD-10-CM | POA: Diagnosis not present

## 2021-04-30 DIAGNOSIS — K219 Gastro-esophageal reflux disease without esophagitis: Secondary | ICD-10-CM | POA: Diagnosis not present

## 2021-04-30 DIAGNOSIS — I10 Essential (primary) hypertension: Secondary | ICD-10-CM | POA: Diagnosis not present

## 2021-04-30 DIAGNOSIS — M170 Bilateral primary osteoarthritis of knee: Secondary | ICD-10-CM | POA: Diagnosis not present

## 2021-04-30 DIAGNOSIS — E782 Mixed hyperlipidemia: Secondary | ICD-10-CM | POA: Diagnosis not present

## 2021-04-30 DIAGNOSIS — E039 Hypothyroidism, unspecified: Secondary | ICD-10-CM | POA: Diagnosis not present

## 2021-04-30 DIAGNOSIS — D509 Iron deficiency anemia, unspecified: Secondary | ICD-10-CM | POA: Diagnosis not present

## 2021-04-30 DIAGNOSIS — N183 Chronic kidney disease, stage 3 unspecified: Secondary | ICD-10-CM | POA: Diagnosis not present

## 2021-05-18 DIAGNOSIS — I10 Essential (primary) hypertension: Secondary | ICD-10-CM | POA: Diagnosis not present

## 2021-05-18 DIAGNOSIS — Z6839 Body mass index (BMI) 39.0-39.9, adult: Secondary | ICD-10-CM | POA: Diagnosis not present

## 2021-05-18 DIAGNOSIS — Z23 Encounter for immunization: Secondary | ICD-10-CM | POA: Diagnosis not present

## 2021-05-18 DIAGNOSIS — N183 Chronic kidney disease, stage 3 unspecified: Secondary | ICD-10-CM | POA: Diagnosis not present

## 2021-05-18 DIAGNOSIS — K219 Gastro-esophageal reflux disease without esophagitis: Secondary | ICD-10-CM | POA: Diagnosis not present

## 2021-05-18 DIAGNOSIS — G40909 Epilepsy, unspecified, not intractable, without status epilepticus: Secondary | ICD-10-CM | POA: Diagnosis not present

## 2021-05-18 DIAGNOSIS — E039 Hypothyroidism, unspecified: Secondary | ICD-10-CM | POA: Diagnosis not present

## 2021-05-18 DIAGNOSIS — M85851 Other specified disorders of bone density and structure, right thigh: Secondary | ICD-10-CM | POA: Diagnosis not present

## 2021-05-18 DIAGNOSIS — E782 Mixed hyperlipidemia: Secondary | ICD-10-CM | POA: Diagnosis not present

## 2021-05-19 ENCOUNTER — Other Ambulatory Visit: Payer: Self-pay | Admitting: Neurology

## 2021-05-25 DIAGNOSIS — E7800 Pure hypercholesterolemia, unspecified: Secondary | ICD-10-CM | POA: Diagnosis not present

## 2021-05-25 DIAGNOSIS — Z0100 Encounter for examination of eyes and vision without abnormal findings: Secondary | ICD-10-CM | POA: Diagnosis not present

## 2021-07-02 DIAGNOSIS — N183 Chronic kidney disease, stage 3 unspecified: Secondary | ICD-10-CM | POA: Diagnosis not present

## 2021-07-06 ENCOUNTER — Other Ambulatory Visit: Payer: Self-pay | Admitting: Neurology

## 2021-07-07 ENCOUNTER — Ambulatory Visit: Payer: Medicare HMO | Admitting: Neurology

## 2021-07-23 ENCOUNTER — Ambulatory Visit: Payer: Medicare HMO | Admitting: Neurology
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
## 2021-08-13 ENCOUNTER — Ambulatory Visit: Payer: Medicare HMO | Admitting: Neurology

## 2021-08-13 ENCOUNTER — Encounter: Payer: Self-pay | Admitting: Neurology

## 2021-08-13 VITALS — BP 146/84 | HR 81 | Ht 64.0 in | Wt 209.0 lb

## 2021-08-13 DIAGNOSIS — G40209 Localization-related (focal) (partial) symptomatic epilepsy and epileptic syndromes with complex partial seizures, not intractable, without status epilepticus: Secondary | ICD-10-CM

## 2021-08-13 DIAGNOSIS — G2581 Restless legs syndrome: Secondary | ICD-10-CM

## 2021-08-13 DIAGNOSIS — R413 Other amnesia: Secondary | ICD-10-CM

## 2021-08-13 MED ORDER — MEMANTINE HCL 10 MG PO TABS: 10.0000 mg | ORAL_TABLET | Freq: Two times a day (BID) | ORAL | 3 refills | Status: DC

## 2021-08-13 NOTE — Patient Instructions (Addendum)
Great to see you today! Continue current medications! Call for any seizures  See you back in 1 year  

## 2021-08-21 ENCOUNTER — Telehealth: Payer: Self-pay | Admitting: Neurology

## 2021-08-21 MED ORDER — GABAPENTIN 100 MG PO CAPS: ORAL_CAPSULE | ORAL | 3 refills | Status: DC

## 2021-08-21 NOTE — Telephone Encounter (Signed)
I spoke to the patient. She would like to continue gabapentin 100mg , one cap in am and two caps QHS. Since being off the medication, she realizes there is benefit on this treatment. She would like it sent to order.

## 2021-08-21 NOTE — Telephone Encounter (Signed)
Pt has called to report she does not believe Sarah, NP ever called in her gabapentin 100mg  3 a day to the mail order pharmacy. Pt believes there may have been a mix up with her Memantine HCl 10 mg Oral 2 times daily.  Pt states she is very much in need of the gabapentin 100mg  3 a day for her legs.  Pt is asking that it goes to either [id number] or [facility]  please call.

## 2021-09-02 DIAGNOSIS — R0981 Nasal congestion: Secondary | ICD-10-CM | POA: Diagnosis not present

## 2021-09-02 DIAGNOSIS — R0600 Dyspnea, unspecified: Secondary | ICD-10-CM | POA: Diagnosis not present

## 2021-09-02 DIAGNOSIS — M791 Myalgia, unspecified site: Secondary | ICD-10-CM | POA: Diagnosis not present

## 2021-09-02 DIAGNOSIS — Z20828 Contact with and (suspected) exposure to other viral communicable diseases: Secondary | ICD-10-CM | POA: Diagnosis not present

## 2021-09-02 DIAGNOSIS — R059 Cough, unspecified: Secondary | ICD-10-CM | POA: Diagnosis not present

## 2021-09-02 DIAGNOSIS — Q401 Congenital hiatus hernia: Secondary | ICD-10-CM | POA: Diagnosis not present

## 2021-09-17 DIAGNOSIS — K449 Diaphragmatic hernia without obstruction or gangrene: Secondary | ICD-10-CM | POA: Diagnosis not present

## 2021-10-05 ENCOUNTER — Telehealth: Payer: Self-pay | Admitting: Neurology

## 2021-10-05 NOTE — Telephone Encounter (Signed)
Patient is asking for a refill of gabapentin. She said she takes three at night for her restless legs and is out.

## 2021-10-05 NOTE — Telephone Encounter (Signed)
Rx for gabapentin 100mg , #270 x 3 sent to her mail order pharmacy on 08/21/21. I called the patient back and she will call them to schedule a delivery.

## 2021-10-21 DIAGNOSIS — K449 Diaphragmatic hernia without obstruction or gangrene: Secondary | ICD-10-CM | POA: Diagnosis not present

## 2021-12-14 ENCOUNTER — Telehealth: Payer: Self-pay | Admitting: Neurology

## 2021-12-14 NOTE — Telephone Encounter (Signed)
Left pt VM (as per DPR). Pt has 3 refills for gabapentin 100 MG capsule on file. I informed the pt via VM and left a call back number for any further questions or concerns. ?

## 2021-12-14 NOTE — Telephone Encounter (Signed)
Pt requesting a refill of gabapentin (NEURONTIN) 100 MG capsule at [facility]. ?

## 2021-12-19 NOTE — Progress Notes (Signed)
?Subjective:  ? ? Robin Horton - 81 y.o. female MRN [id number]  Date of birth: 04/15/1941 ? ?HPI ? ?Robin Horton is to establish care.  ? ?Current issues and/or concerns: ?Reports in the past followed by a provider who said she had a hiatal hernia. Reports referred to a surgeon but should have been referred to Gastroenterology. Requesting referral to the same. No additional issues or concerns.  ? ?ROS per HPI  ? ? ? ?Health Maintenance:  ?Health Maintenance Due  ?Topic Date Due  ? COVID-19 Vaccine (1) Never done  ? TETANUS/TDAP  Never done  ? Zoster Vaccines- Shingrix (1 of 2) Never done  ? ? ? ?Past Medical History: ?Patient Active Problem List  ? Diagnosis Date Noted  ? Gait abnormality 01/01/2020  ? Memory loss 01/01/2020  ? Partial symptomatic epilepsy with complex partial seizures, not intractable, without status epilepticus ([place]) 02/22/2018  ? Obese 11/30/2017  ? S/P right TKA 11/28/2017  ? RLS (restless legs syndrome) 08/25/2016  ? Benign essential hypertension 10/20/2015  ? Dyslipidemia 10/20/2015  ? Heartburn 10/20/2015  ? Hypothyroidism 10/20/2015  ? Memory loss, short term 10/20/2015  ? Palpitations 10/20/2015  ? Primary osteoarthritis of left knee 10/20/2015  ? Vitamin D deficiency 10/20/2015  ? ? ?Social History  ? reports that she has quit smoking. She has never used smokeless tobacco. She reports that she does not drink alcohol and does not use drugs.  ? ?Family History  ?family history includes Diabetes in her father; Stroke in her mother.  ? ?Medications: reviewed and updated ?  ?Objective:  ? Physical Exam ?BP 139/75 (BP Location: Left Arm, Patient Position: Sitting, Cuff Size: Large)   Pulse 75   Temp 98.3 ?F (36.8 ?C)   Resp 18   Ht 5' 4.02" (1.626 m)   Wt 200 lb (90.7 kg)   SpO2 95%   BMI 34.31 kg/m?  ? ?Physical Exam ?HENT:  ?   Head: Normocephalic and atraumatic.  ?Eyes:  ?   Extraocular Movements: Extraocular movements intact.  ?   Conjunctiva/sclera: Conjunctivae normal.  ?    Pupils: Pupils are equal, round, and reactive to light.  ?Cardiovascular:  ?   Rate and Rhythm: Normal rate and regular rhythm.  ?   Pulses: Normal pulses.  ?   Heart sounds: Normal heart sounds.  ?Pulmonary:  ?   Effort: Pulmonary effort is normal.  ?   Breath sounds: Normal breath sounds.  ?Musculoskeletal:  ?   Cervical back: Normal range of motion and neck supple.  ?Neurological:  ?   General: No focal deficit present.  ?   Mental Status: She is alert and oriented to person, place, and time.  ?Psychiatric:     ?   Mood and Affect: Mood normal.     ?   Behavior: Behavior normal.  ? ? ?   ?Assessment & Plan:  ?1. Encounter to establish care: ?- Patient presents today to establish care.  ?- Return for annual physical examination, labs, and health maintenance. ? ?2. Hiatal hernia: ?- Referral to Gastroenterology for further evaluation and management.  ?- Ambulatory referral to Gastroenterology ? ? ? ?Patient was given clear instructions to go to Emergency Department or return to medical center if symptoms don't improve, worsen, or new problems develop.The patient verbalized understanding. ? ?I discussed the assessment and treatment plan with the patient. The patient was provided an opportunity to ask questions and all were answered. The patient agreed with the plan and  demonstrated an understanding of the instructions. ?  ?The patient was advised to call back or seek an in-person evaluation if the symptoms worsen or if the condition fails to improve as anticipated. ? ? ? ?Durene Fruits, NP ?12/25/2021, 3:53 PM ?Primary Care at [facility]   ?

## 2021-12-25 ENCOUNTER — Ambulatory Visit (INDEPENDENT_AMBULATORY_CARE_PROVIDER_SITE_OTHER): Payer: Medicare HMO | Admitting: Family

## 2021-12-25 VITALS — BP 139/75 | HR 75 | Temp 98.3°F | Resp 18 | Ht 64.02 in | Wt 200.0 lb

## 2021-12-25 DIAGNOSIS — K449 Diaphragmatic hernia without obstruction or gangrene: Secondary | ICD-10-CM

## 2021-12-25 DIAGNOSIS — Z7689 Persons encountering health services in other specified circumstances: Secondary | ICD-10-CM | POA: Diagnosis not present

## 2021-12-25 NOTE — Progress Notes (Signed)
Pt presents to establish care 

## 2021-12-25 NOTE — Patient Instructions (Signed)
Thank you for choosing Primary Care at [facility] for your medical home!   ? ?Robin Horton was seen by Camillia Herter, NP today.  ? ?Robin Horton's primary care provider is Camillia Herter, NP.   ?For the best care possible,  you should try to see Durene Fruits, NP ?whenever you come to office.  ? ?We look forward to seeing you again soon! ? ?If you have any questions about your visit today,  ?please call us at [phone] ? ?Or feel free to reach your provider via [place].   ? ?

## 2022-02-10 ENCOUNTER — Encounter: Payer: Self-pay | Admitting: Gastroenterology

## 2022-03-03 ENCOUNTER — Encounter: Payer: Self-pay | Admitting: Gastroenterology

## 2022-03-03 ENCOUNTER — Ambulatory Visit (INDEPENDENT_AMBULATORY_CARE_PROVIDER_SITE_OTHER): Payer: Medicare HMO | Admitting: Gastroenterology

## 2022-03-03 VITALS — BP 164/70 | HR 90 | Ht 64.5 in | Wt 202.0 lb

## 2022-03-03 DIAGNOSIS — K449 Diaphragmatic hernia without obstruction or gangrene: Secondary | ICD-10-CM | POA: Diagnosis not present

## 2022-03-03 DIAGNOSIS — Z1211 Encounter for screening for malignant neoplasm of colon: Secondary | ICD-10-CM | POA: Diagnosis not present

## 2022-03-03 DIAGNOSIS — Z1212 Encounter for screening for malignant neoplasm of rectum: Secondary | ICD-10-CM | POA: Diagnosis not present

## 2022-03-03 DIAGNOSIS — K219 Gastro-esophageal reflux disease without esophagitis: Secondary | ICD-10-CM | POA: Diagnosis not present

## 2022-03-03 MED ORDER — OMEPRAZOLE 20 MG PO CPDR: 20.0000 mg | DELAYED_RELEASE_CAPSULE | Freq: Two times a day (BID) | ORAL | 3 refills | Status: DC

## 2022-03-03 NOTE — Patient Instructions (Addendum)
We have sent the following medications to your pharmacy for you to pick up at your convenience: omeprazole 20 mg twice daily.   Your provider has ordered Cologuard testing as an option for colon cancer screening. This is performed by [employer] and may be out of network with your insurance. PRIOR to completing the test, it is YOUR responsibility to contact your insurance about covered benefits for this test. Your out of pocket expense could be anywhere from $0.00 to $649.00.   When you call to check coverage with your insurer, please provide the following information:   -The ONLY provider of Cologuard is Optician, dispensing  - CPT code for Cologuard is [phone].  [facility] NPI # [id number]  -[employer] Tax ID # [id number]   We have already sent your demographic and insurance information to [employer] (phone number [phone]) and they should contact you within the next week regarding your test. If you have not heard from them within the next week, please call our office at [phone].   You have been scheduled for an endoscopy and colonoscopy. Please follow the written instructions given to you at your visit today. Please pick up your prep supplies at the pharmacy within the next 1-3 days. If you use inhalers (even only as needed), please bring them with you on the day of your procedure.  The [facility] providers would like to encourage you to use [facility] to communicate with providers for non-urgent requests or questions.  Due to long hold times on the telephone, sending your provider a message by [facility] may be a faster and more efficient way to get a response.  Please allow 48 business hours for a response.  Please remember that this is for non-urgent requests.   Due to recent changes in healthcare laws, you may see the results of your imaging and laboratory studies on MyChart before your provider has had a chance to review them.  We  understand that in some cases there may be results that are confusing or concerning to you. Not all laboratory results come back in the same time frame and the provider may be waiting for multiple results in order to interpret others.  Please give [place] 48 hours in order for your provider to thoroughly review all the results before contacting the office for clarification of your results.

## 2022-03-03 NOTE — Progress Notes (Signed)
I agree with the above note, plan 

## 2022-03-03 NOTE — Progress Notes (Signed)
03/03/2022 Robin Horton [id number] 02/18/1941   HISTORY OF PRESENT ILLNESS: This is a pleasant 81 year old female who is referred here by Ricky Stabs, NP, for evaluation of a hiatal hernia.  She tells me that she had COVID and they did a chest x-ray and told her she had a large hiatal hernia and needed to see a Careers adviser.  She was seen at [facility] and they told her that she needed to see GI for medical management of her GERD.  She says that she has had acid reflux issues for years.  She has been on omeprazole 20 mg daily over-the-counter and sometimes takes it twice daily as well as taking a lot of Tums.  She denies any dysphagia, abdominal pain, chest pain, etc.  She has never had an endoscopy.  She has never had a colonoscopy.  Sounds like her mom either had a locally advanced colon polyp or maybe a carcinoma in situ.  She says that she had it removed endoscopically and never required chemo or radiation.  Patient states that she moves her bowels regularly without issues.  No rectal bleeding.   Past Medical History:  Diagnosis Date   Anxiety    Dyslipidemia    GERD (gastroesophageal reflux disease)    History of bronchitis    History of hiatal hernia    History of palpitations    History of short term memory loss    Hypertension    Hypothyroidism    Iron deficiency anemia    OA (osteoarthritis) of knee    Left   Restless legs syndrome (RLS)    Seizures (HCC)    Myoclonic epilepsy   Urinary incontinence    Vitamin D deficiency    Past Surgical History:  Procedure Laterality Date   ABDOMINAL HYSTERECTOMY     BREAST SURGERY     fatty tissue   COLONOSCOPY     JOINT REPLACEMENT Left    Knee   KNEE SURGERY     TONSILLECTOMY     TOTAL KNEE ARTHROPLASTY Right 11/28/2017   Procedure: RIGHT TOTAL KNEE ARTHROPLASTY;  Surgeon: Durene Romans, MD;  Location: [facility];  Service: Orthopedics;  Laterality: Right;  Adductor Block    reports that she has quit smoking. She has never used  smokeless tobacco. She reports that she does not drink alcohol and does not use drugs. family history includes Diabetes in her father; Stroke in her mother. Allergies  Allergen Reactions   Lamictal [Lamotrigine] Rash   Hydrochlorothiazide Other (See Comments)    Other reaction(s): Other Renal insufficiency Renal insufficiency    Sulfamethoxazole Rash      Outpatient Encounter Medications as of 03/03/2022  Medication Sig   alendronate (FOSAMAX) 70 MG tablet Take 70 mg by mouth once a week.   amLODipine (NORVASC) 5 MG tablet Take 5 mg by mouth daily.   atorvastatin (LIPITOR) 40 MG tablet Take 40 mg by mouth daily.   Cholecalciferol 25 MCG (1000 UT) tablet Take 1,000 Units by mouth daily.   furosemide (LASIX) 40 MG tablet Take 40 mg by mouth daily.   gabapentin (NEURONTIN) 100 MG capsule Take one cap in am and two caps QHS.   levETIRAcetam (KEPPRA) 750 MG tablet TAKE 1 TABLET (750 MG ) BY MOUTH 2 (TWO) TIMES DAILY.   levothyroxine (SYNTHROID) 75 MCG tablet Take 75 mcg by mouth daily before breakfast.   memantine (NAMENDA) 10 MG tablet Take 1 tablet (10 mg total) by mouth 2 (two) times daily.  omeprazole (PRILOSEC OTC) 20 MG tablet Take 20-40 mg by mouth daily.   No facility-administered encounter medications on file as of 03/03/2022.     REVIEW OF SYSTEMS  : All other systems reviewed and negative except where noted in the History of Present Illness.   PHYSICAL EXAM: BP (!) 164/70   Pulse 90   Ht 5' 4.5" (1.638 m)   Wt 202 lb (91.6 kg)   BMI 34.14 kg/m  General: Well developed white female in no acute distress Head: Normocephalic and atraumatic Eyes:  Sclerae anicteric, conjunctiva pink. Ears: Normal auditory acuity Lungs: Clear throughout to auscultation; no W/R/R. Heart: Regular rate and rhythm; no M/R/G. Abdomen: Soft, non-distended.  BS present.  Non-tender. Musculoskeletal: Symmetrical with no gross deformities  Skin: No lesions on visible extremities Extremities:  No edema  Neurological: Alert oriented x 4, grossly non-focal Psychological:  Alert and cooperative. Normal mood and affect  ASSESSMENT AND PLAN: *GERD and hiatal hernia: Has battled acid reflux for years.  Takes omeprazole 20 mg once daily, sometimes twice daily and uses a lot of Tums.  Was told recently on a chest x-ray that she has a large hiatal hernia.  I think she needs to at least be on omeprazole 20 mg twice daily.  She is taking over-the-counter so I sent a prescription for her instead.  We will plan for EGD with Dr. Christella Hartigan. *CRC screening:  Never had a colonoscopy in the past.  Will perform Cologuard and if positive then will proceed with colonoscopy.    **The risks, benefits, and alternatives to EGD were discussed with the patient and she consents to proceed.   CC:  Rema Fendt, NP

## 2022-03-11 LAB — COLOGUARD

## 2022-03-12 ENCOUNTER — Encounter: Payer: Self-pay | Admitting: Family

## 2022-03-12 ENCOUNTER — Ambulatory Visit (INDEPENDENT_AMBULATORY_CARE_PROVIDER_SITE_OTHER): Payer: Medicare HMO | Admitting: Family

## 2022-03-12 VITALS — BP 122/77 | HR 78 | Temp 98.1°F | Resp 16 | Ht 64.5 in | Wt 200.6 lb

## 2022-03-12 DIAGNOSIS — Z130 Encounter for screening for diseases of the blood and blood-forming organs and certain disorders involving the immune mechanism: Secondary | ICD-10-CM | POA: Diagnosis not present

## 2022-03-12 DIAGNOSIS — M85852 Other specified disorders of bone density and structure, left thigh: Secondary | ICD-10-CM | POA: Insufficient documentation

## 2022-03-12 DIAGNOSIS — Z13220 Encounter for screening for lipoid disorders: Secondary | ICD-10-CM

## 2022-03-12 DIAGNOSIS — Z13228 Encounter for screening for other metabolic disorders: Secondary | ICD-10-CM

## 2022-03-12 DIAGNOSIS — Z0000 Encounter for general adult medical examination without abnormal findings: Secondary | ICD-10-CM

## 2022-03-12 DIAGNOSIS — Z131 Encounter for screening for diabetes mellitus: Secondary | ICD-10-CM

## 2022-03-12 DIAGNOSIS — Z1329 Encounter for screening for other suspected endocrine disorder: Secondary | ICD-10-CM

## 2022-03-12 DIAGNOSIS — Z5320 Procedure and treatment not carried out because of patient's decision for unspecified reasons: Secondary | ICD-10-CM | POA: Diagnosis not present

## 2022-03-12 NOTE — Progress Notes (Signed)
Medicare Annual Wellness Visit Subsequent Exam  Robin Horton is a 81 y.o. female who presents for a Medicare Annual Wellness Visit Subsequent Exam.   Patient Active Problem List   Diagnosis Date Noted  . Other specified disorders of bone density and structure, left thigh 03/12/2022  . Gastroesophageal reflux disease 03/03/2022  . Hiatal hernia 03/03/2022  . Screening for colorectal cancer 03/03/2022  . Gait abnormality 01/01/2020  . Memory loss 01/01/2020  . Partial symptomatic epilepsy with complex partial seizures, not intractable, without status epilepticus ([place]) 02/22/2018  . Obese 11/30/2017  . S/P right TKA 11/28/2017  . RLS (restless legs syndrome) 08/25/2016  . Benign essential hypertension 10/20/2015  . Dyslipidemia 10/20/2015  . Heartburn 10/20/2015  . Hypothyroidism 10/20/2015  . Memory loss, short term 10/20/2015  . Palpitations 10/20/2015  . Primary osteoarthritis of left knee 10/20/2015  . Vitamin D deficiency 10/20/2015    Health Maintenance Due  Topic Date Due  . TETANUS/TDAP  Never done  . Zoster Vaccines- Shingrix (1 of 2) Never done    Health Habits Exercise: infrequently Diet: overall balanced   Depression Screen    03/12/2022    1:36 PM 12/25/2021    3:35 PM  Depression screen PHQ 2/9  Decreased Interest 0 0  Down, Depressed, Hopeless 0 0  PHQ - 2 Score 0 0  Altered sleeping 0   Tired, decreased energy 0   Change in appetite 0   Feeling bad or failure about yourself  0   Trouble concentrating 0   Moving slowly or fidgety/restless 0   Suicidal thoughts 0   PHQ-9 Score 0   Difficult doing work/chores Not difficult at all    Screenings Mammogram: patient declined  DEXA Scan: patient declined   Functional Ability Does the patient need help with: Ron Parker index)         Bathing: no         Dressing : no         Toileting: no         Transferring: no         Continence: no         Feeding: no           Safety Screen Does the home  have:         Rugs in the hallway: no         Grab bars in the bathroom: yes         Handrails on the stairs:not applicable         Stairs in home: no         Poor lightning: no  Hearing Evaluation Do you have trouble hearing the television when others do not? no Do you have to strain to hear/understand conversations? no   Falls Risk: Does the patient need assistance with ambulation? Yes, cane Does the patient have a history of a fall in the last 90 days? no Is the patient at risk for falls? Mild risk, ambulating with cane, history of bilateral knee replacements Was the patient's timed "Get Up and Go Test" unsteady or longer than 30 seconds? no .   Advanced Care Planning Patient has executed an Advance Directive: yes If no, patient was given the opportunity to execute an Advance Directive today? Not applicable This patient has the ability to prepare an Advance Directive: not applicable   Cognitive Assessment Does the patient have evidence of cognitive impairment? No The patient does not have evidence of a change  in mood/affect, appearance, speech, memory or motor skills.      03/12/2022    1:34 PM 07/03/2020    8:22 AM 01/01/2020    8:47 AM  MMSE - Mini Mental State Exam  Orientation to time _0 Orientation to Place _1 Registration _2 Attention/ Calculation _3 Attention/Calculation-comments  Refused to count backwards, wanted to spell backwards instead   Recall _4 Language- name 2 objects _5 Language- repeat 1 0 0  Language- follow 3 step command _6 Language- read & follow direction _7 Write a sentence _8 Copy design _9 Total score _10 PHYSICAL EXAM: Vitals:   03/12/22 1330  BP: 122/77  Pulse: 78  Resp: 16  Temp: 98.1 F (36.7 C)  TempSrc: Oral  SpO2: 94%  Weight: 200 lb 9.6 oz (91 kg)  Height: 5' 4.5" (1.638 m)   Body mass index is 33.9 kg/m.   Vision screen performed. See vision activity in chart for  documentation  Physical Exam HENT:     Head: Normocephalic and atraumatic.     Right Ear: Tympanic membrane, ear canal and external ear normal.     Left Ear: Tympanic membrane, ear canal and external ear normal.     Nose: Nose normal.     Mouth/Throat:     Mouth: Mucous membranes are moist.     Pharynx: Oropharynx is clear.  Eyes:     Extraocular Movements: Extraocular movements intact.     Conjunctiva/sclera: Conjunctivae normal.     Pupils: Pupils are equal, round, and reactive to light.  Cardiovascular:     Rate and Rhythm: Normal rate and regular rhythm.     Pulses: Normal pulses.     Heart sounds: Normal heart sounds.  Pulmonary:     Effort: Pulmonary effort is normal.     Breath sounds: Normal breath sounds.  Chest:     Comments: Patient declined.  Abdominal:     General: Bowel sounds are normal.     Palpations: Abdomen is soft.  Genitourinary:    Comments: Patient declined.  Musculoskeletal:        General: Normal range of motion.     Right shoulder: Normal.     Left shoulder: Normal.     Right upper arm: Normal.     Left upper arm: Normal.     Right elbow: Normal.     Left elbow: Normal.     Right forearm: Normal.     Left forearm: Normal.     Right wrist: Normal.     Left wrist: Normal.     Right hand: Normal.     Left hand: Normal.     Cervical back: Normal, normal range of motion and neck supple.     Thoracic back: Normal.     Lumbar back: Normal.     Right hip: Normal.     Left hip: Normal.     Right upper leg: Normal.     Left upper leg: Normal.     Right knee: Normal.     Left knee: Normal.     Right lower leg: Normal.     Left lower leg: Normal.     Right ankle: Normal.     Left ankle: Normal.     Right foot: Normal.     Left foot: Normal.  Skin:    General: Skin is warm and dry.     Capillary Refill: Capillary refill takes less than 2 seconds.  Neurological:     General: No focal deficit present.     Mental Status: She is alert and  oriented to person, place, and time.  Psychiatric:        Mood and Affect: Mood normal.        Behavior: Behavior normal.   Assessment and Plan: 1. Medicare annual wellness visit, subsequent - During the course of the visit the patient was educated and counseled about appropriate screening and preventive services - Counseled on exercise as tolerated, healthy eating (including decreased daily intake of saturated fats, cholesterol, added sugars, sodium), STI prevention, and routine healthcare maintenance.  2. Screening for metabolic disorder - MBE67+JQGB to check kidney function, liver function, and electrolyte balance.  - CMP14+EGFR  3. Screening for deficiency anemia - CBC to screen for anemia. - CBC  4. Diabetes mellitus screening - Hemoglobin A1c to screen for pre-diabetes/diabetes. - Hemoglobin A1c  5. Screening cholesterol level - Lipid panel to screen for high cholesterol.  - Lipid Panel  6. Thyroid disorder screen - TSH to check thyroid function.  - TSH  7. Screening mammography declined - Patient declined.   8. Osteoporosis screening declined - Patient declined.    The following orders were placed at today's visit; Orders Placed This Encounter  Procedures  . CMP14+EGFR  . CBC  . TSH  . Hemoglobin A1c  . Hemoglobin A1c     An after visit summary with all of these plans was given to the patient.  Patient was given clear instructions to go to Emergency Department or return to medical center if symptoms don't improve, worsen, or new problems develop.The patient verbalized understanding.

## 2022-03-13 ENCOUNTER — Other Ambulatory Visit: Payer: Self-pay | Admitting: Family

## 2022-03-13 DIAGNOSIS — N1832 Chronic kidney disease, stage 3b: Secondary | ICD-10-CM

## 2022-03-13 DIAGNOSIS — Z13220 Encounter for screening for lipoid disorders: Secondary | ICD-10-CM

## 2022-03-13 LAB — CMP14+EGFR
ALT: 18 IU/L (ref 0–32)
AST: 29 IU/L (ref 0–40)
Albumin/Globulin Ratio: 1.8 (ref 1.2–2.2)
Albumin: 4.3 g/dL (ref 3.7–4.7)
Alkaline Phosphatase: 98 IU/L (ref 44–121)
BUN/Creatinine Ratio: 15 (ref 12–28)
BUN: 21 mg/dL (ref 8–27)
Bilirubin Total: 0.5 mg/dL (ref 0.0–1.2)
CO2: 21 mmol/L (ref 20–29)
Calcium: 9.7 mg/dL (ref 8.7–10.3)
Chloride: 102 mmol/L (ref 96–106)
Creatinine, Ser: 1.43 mg/dL — ABNORMAL HIGH (ref 0.57–1.00)
Globulin, Total: 2.4 g/dL (ref 1.5–4.5)
Glucose: 88 mg/dL (ref 70–99)
Potassium: 4.4 mmol/L (ref 3.5–5.2)
Sodium: 142 mmol/L (ref 134–144)
Total Protein: 6.7 g/dL (ref 6.0–8.5)
eGFR: 37 mL/min/{1.73_m2} — ABNORMAL LOW (ref >59)

## 2022-03-13 LAB — LIPID PANEL

## 2022-03-13 LAB — CBC
Hematocrit: 40.6 % (ref 34.0–46.6)
Hemoglobin: 13.7 g/dL (ref 11.1–15.9)
MCH: 30.3 pg (ref 26.6–33.0)
MCHC: 33.7 g/dL (ref 31.5–35.7)
MCV: 90 fL (ref 79–97)
Platelets: 221 10*3/uL (ref 150–450)
RBC: 4.52 x10E6/uL (ref 3.77–5.28)
RDW: 13.1 % (ref 11.7–15.4)
WBC: 7.1 10*3/uL (ref 3.4–10.8)

## 2022-03-13 LAB — SPECIMEN STATUS REPORT

## 2022-03-13 LAB — HEMOGLOBIN A1C
Est. average glucose Bld gHb Est-mCnc: 108 mg/dL
Hgb A1c MFr Bld: 5.4 % (ref 4.8–5.6)

## 2022-03-13 LAB — TSH: TSH: 2.66 u[IU]/mL (ref 0.450–4.500)

## 2022-03-15 ENCOUNTER — Telehealth: Payer: Self-pay

## 2022-03-15 NOTE — Telephone Encounter (Signed)
Pt given lab results per notes of Amy, NP on 03/15/22. Pt verbalized understanding and scheduled lab appt for tomorrow at 0830. No further assistance needed.

## 2022-03-16 ENCOUNTER — Encounter: Payer: Self-pay | Admitting: Certified Registered Nurse Anesthetist

## 2022-03-16 ENCOUNTER — Other Ambulatory Visit: Payer: Medicare HMO

## 2022-03-16 DIAGNOSIS — Z13220 Encounter for screening for lipoid disorders: Secondary | ICD-10-CM | POA: Diagnosis not present

## 2022-03-16 NOTE — Progress Notes (Signed)
Lipid panel collected  

## 2022-03-17 LAB — LIPID PANEL
Chol/HDL Ratio: 2.4 ratio (ref 0.0–4.4)
Cholesterol, Total: 214 mg/dL — ABNORMAL HIGH (ref 100–199)
HDL: 89 mg/dL (ref >39)
LDL Chol Calc (NIH): 100 mg/dL — ABNORMAL HIGH (ref 0–99)
Triglycerides: 146 mg/dL (ref 0–149)
VLDL Cholesterol Cal: 25 mg/dL (ref 5–40)

## 2022-03-19 ENCOUNTER — Ambulatory Visit (AMBULATORY_SURGERY_CENTER): Payer: Medicare HMO | Admitting: Gastroenterology

## 2022-03-19 ENCOUNTER — Encounter: Payer: Self-pay | Admitting: Gastroenterology

## 2022-03-19 VITALS — BP 117/73 | HR 65 | Temp 98.6°F | Resp 14 | Ht 64.0 in | Wt 202.0 lb

## 2022-03-19 DIAGNOSIS — K219 Gastro-esophageal reflux disease without esophagitis: Secondary | ICD-10-CM

## 2022-03-19 DIAGNOSIS — I10 Essential (primary) hypertension: Secondary | ICD-10-CM | POA: Diagnosis not present

## 2022-03-19 DIAGNOSIS — R12 Heartburn: Secondary | ICD-10-CM | POA: Diagnosis not present

## 2022-03-19 DIAGNOSIS — K449 Diaphragmatic hernia without obstruction or gangrene: Secondary | ICD-10-CM

## 2022-03-19 DIAGNOSIS — R1013 Epigastric pain: Secondary | ICD-10-CM | POA: Diagnosis not present

## 2022-03-19 MED ORDER — OMEPRAZOLE 40 MG PO CPDR: 40.0000 mg | DELAYED_RELEASE_CAPSULE | Freq: Two times a day (BID) | ORAL | 3 refills | Status: DC

## 2022-03-19 MED ORDER — SODIUM CHLORIDE 0.9 % IV SOLN: 500.0000 mL | Freq: Once | INTRAVENOUS | Status: DC

## 2022-03-19 NOTE — Progress Notes (Signed)
0905 Robinul 0.1 mg IV given due large amount of secretions upon assessment.  MD made aware, vss

## 2022-03-19 NOTE — Progress Notes (Signed)
  The recent H&P (dated 03/03/2022) was reviewed, the patient was examined and there is no change in the patients condition since that H&P was completed.   Rachael Fee  03/19/2022, 8:59 AM

## 2022-03-19 NOTE — Patient Instructions (Addendum)
YOU HAD AN ENDOSCOPIC PROCEDURE TODAY AT THE [facility]:   Refer to the procedure report that was given to you for any specific questions about what was found during the examination.  If the procedure report does not answer your questions, please call your gastroenterologist to clarify.  If you requested that your care partner not be given the details of your procedure findings, then the procedure report has been included in a sealed envelope for you to review at your convenience later.  YOU SHOULD EXPECT: Some feelings of bloating in the abdomen. Passage of more gas than usual.  Walking can help get rid of the air that was put into your GI tract during the procedure and reduce the bloating. If you had a lower endoscopy (such as a colonoscopy or flexible sigmoidoscopy) you may notice spotting of blood in your stool or on the toilet paper. If you underwent a bowel prep for your procedure, you may not have a normal bowel movement for a few days.  Please Note:  You might notice some irritation and congestion in your nose or some drainage.  This is from the oxygen used during your procedure.  There is no need for concern and it should clear up in a day or so.  SYMPTOMS TO REPORT IMMEDIATELY:  Following upper endoscopy (EGD)  Vomiting of blood or coffee ground material  New chest pain or pain under the shoulder blades  Painful or persistently difficult swallowing  New shortness of breath  Fever of 100F or higher  Black, tarry-looking stools  For urgent or emergent issues, a gastroenterologist can be reached at any hour by calling [phone]. Do not use MyChart messaging for urgent concerns.    DIET:  We do recommend a small meal at first, but then you may proceed to your regular diet.  Drink plenty of fluids but you should avoid alcoholic beverages for 24 hours.  ACTIVITY:  You should plan to take it easy for the rest of today and you should NOT DRIVE or use heavy machinery until  tomorrow (because of the sedation medicines used during the test).    FOLLOW UP: Our staff will call the number listed on your records the next business day following your procedure.  We will call around 7:15- 8:00 am to check on you and address any questions or concerns that you may have regarding the information given to you following your procedure. If we do not reach you, we will leave a message.  If you develop any symptoms (ie: fever, flu-like symptoms, shortness of breath, cough etc.) before then, please call [phone].  If you test positive for Covid 19 in the 2 weeks post procedure, please call and report this information to us.    If any biopsies were taken you will be contacted by phone or by letter within the next 1-3 weeks.  Please call us at [phone] if you have not heard about the biopsies in 3 weeks.    SIGNATURES/CONFIDENTIALITY: You and/or your care partner have signed paperwork which will be entered into your electronic medical record.  These signatures attest to the fact that that the information above on your After Visit Summary has been reviewed and is understood.  Full responsibility of the confidentiality of this discharge information lies with you and/or your care-partner.  

## 2022-03-19 NOTE — Progress Notes (Signed)
Report given to PACU, vss 

## 2022-03-19 NOTE — Op Note (Signed)
[place] Patient Name: Robin Horton Procedure Date: 03/19/2022 9:02 AM MRN: [id number] Endoscopist: Milus Banister , MD Age: 81 Referring MD:  Date of Birth: May 05, 1941 Gender: Female Account #: [id number] Procedure:                Upper GI endoscopy Indications:              Dyspepsia, Heartburn, known hiatal hernia Medicines:                Monitored Anesthesia Care Procedure:                Pre-Anesthesia Assessment:                           - Prior to the procedure, a History and Physical                            was performed, and patient medications and                            allergies were reviewed. The patient's tolerance of                            previous anesthesia was also reviewed. The risks                            and benefits of the procedure and the sedation                            options and risks were discussed with the patient.                            All questions were answered, and informed consent                            was obtained. Prior Anticoagulants: The patient has                            taken no previous anticoagulant or antiplatelet                            agents. ASA Grade Assessment: II - A patient with                            mild systemic disease. After reviewing the risks                            and benefits, the patient was deemed in                            satisfactory condition to undergo the procedure.                           After obtaining informed consent, the endoscope was  passed under direct vision. Throughout the                            procedure, the patient's blood pressure, pulse, and                            oxygen saturations were monitored continuously. The                            Endoscope was introduced through the mouth, and                            advanced to the second part of duodenum. The upper                            GI endoscopy was  accomplished without difficulty.                            The patient tolerated the procedure well. Scope In: Scope Out: Findings:                 A large hiatal hernia was present. I suspect about                            75% if her stomach is intrathoracic. This causes                            the usual esophageal tortuosity and foreshortening.                           The exam was otherwise without abnormality. Complications:            No immediate complications. Estimated blood loss:                            None. Estimated Blood Loss:     Estimated blood loss: none. Impression:               - A large hiatal hernia was present. I suspect                            about 75% if her stomach is intrathoracic. This                            causes the usual esophageal tortuosity and                            foreshortening.                           - The examination was otherwise normal. Recommendation:           - Patient has a contact number available for                            emergencies. The signs and symptoms  of potential                            delayed complications were discussed with the                            patient. Return to normal activities tomorrow.                            Written discharge instructions were provided to the                            patient.                           - Resume previous diet.                           - Continue present medications. New prescription                            written today for omeprazole 40mg  pills, one pill                            twice daily before breakfast and dinner meals. Disp                            3 months with three refills.                           - Follow up office visit with Dr. in 6-8                            weeks to see how you feel on the increased antiacid                            regimen. Christella Hartigan, MD 03/19/2022 9:26:14 AM This report has been  signed electronically.

## 2022-03-22 ENCOUNTER — Telehealth: Payer: Self-pay

## 2022-03-22 DIAGNOSIS — Z1212 Encounter for screening for malignant neoplasm of rectum: Secondary | ICD-10-CM | POA: Diagnosis not present

## 2022-03-22 DIAGNOSIS — Z1211 Encounter for screening for malignant neoplasm of colon: Secondary | ICD-10-CM | POA: Diagnosis not present

## 2022-03-22 NOTE — Telephone Encounter (Signed)
  Follow up Call-     03/19/2022    8:28 AM  Call back number  Post procedure Call Back phone  # [phone]  Permission to leave phone message Yes     Patient questions:  Do you have a fever, pain , or abdominal swelling? No. Pain Score  0 *  Have you tolerated food without any problems? Yes.    Have you been able to return to your normal activities? Yes.    Do you have any questions about your discharge instructions: Diet   No. Medications  No. Follow up visit  No.  Do you have questions or concerns about your Care? No.  Actions: * If pain score is 4 or above: No action needed, pain <4.

## 2022-03-30 ENCOUNTER — Telehealth: Payer: Self-pay | Admitting: Gastroenterology

## 2022-03-30 LAB — COLOGUARD: COLOGUARD: NEGATIVE

## 2022-03-30 NOTE — Telephone Encounter (Signed)
Inbound call from patient stating that she would like to discuss if Prilosec can cause constipation. Patient stated that she has been constipated and she believes it is coming from the medication. Please advise.

## 2022-03-30 NOTE — Telephone Encounter (Signed)
The pt has been advised and will begin miralax daily as needed to maintain bowel movements.She will keep the appt as planned for 8/29.

## 2022-03-30 NOTE — Telephone Encounter (Signed)
Dr Christella Hartigan the pt states that since starting Prilosec she has developed constipation.  She would like to know if she should stop it or should she just add miralax to her regimen.  She has a follow up 8/29.  Please advise

## 2022-04-08 ENCOUNTER — Encounter: Payer: Self-pay | Admitting: Emergency Medicine

## 2022-04-08 ENCOUNTER — Ambulatory Visit (INDEPENDENT_AMBULATORY_CARE_PROVIDER_SITE_OTHER): Payer: Medicare HMO

## 2022-04-08 ENCOUNTER — Ambulatory Visit
Admission: EM | Admit: 2022-04-08 | Discharge: 2022-04-08 | Disposition: A | Discharge status: Home / Self Care | Payer: Medicare HMO | Attending: Internal Medicine | Admitting: Internal Medicine

## 2022-04-08 DIAGNOSIS — J069 Acute upper respiratory infection, unspecified: Secondary | ICD-10-CM | POA: Diagnosis not present

## 2022-04-08 DIAGNOSIS — K449 Diaphragmatic hernia without obstruction or gangrene: Secondary | ICD-10-CM | POA: Diagnosis not present

## 2022-04-08 DIAGNOSIS — R0602 Shortness of breath: Secondary | ICD-10-CM | POA: Diagnosis not present

## 2022-04-08 MED ORDER — FLUTICASONE PROPIONATE 50 MCG/ACT NA SUSP: 1.0000 | Freq: Every day | NASAL | 0 refills | Status: DC

## 2022-04-08 MED ORDER — ALBUTEROL SULFATE HFA 108 (90 BASE) MCG/ACT IN AERS: 1.0000 | INHALATION_SPRAY | Freq: Four times a day (QID) | RESPIRATORY_TRACT | 0 refills | Status: DC | PRN

## 2022-04-08 MED ORDER — BENZONATATE 100 MG PO CAPS: 100.0000 mg | ORAL_CAPSULE | Freq: Three times a day (TID) | ORAL | 0 refills | Status: DC | PRN

## 2022-04-08 NOTE — Discharge Instructions (Signed)
It appears that you have a viral upper respiratory infection.  You have been sent 3 medications to help alleviate symptoms.  Albuterol inhaler has been prescribed to alleviate shortness of breath and is to be used as needed.  Please follow-up if symptoms persist or worsen.  COVID test is pending.  We will call if it is positive.  Your chest x-ray was normal.

## 2022-04-08 NOTE — ED Triage Notes (Signed)
Pt is present today with c/o cough, sinus pressure, and nasal congestion. Pt sx started x2 days ago

## 2022-04-08 NOTE — ED Provider Notes (Signed)
[facility]    CSN: [id number] Arrival date & time: 04/08/22  1303      History   Chief Complaint Chief Complaint  Patient presents with   Cough   Nasal Congestion    HPI Jaliana Medellin Blecha is a 81 y.o. female.   Patient presents with cough, sinus pressure, nasal congestion that started about 2 days ago.  She does report some intermittent shortness of breath that is mild.  Denies chest pain, sore throat, ear pain, nausea, vomiting, diarrhea, abdominal pain.  Denies any known sick contacts or fever.  She has not taken any medications to help alleviate symptoms.  Denies history of asthma or COPD.   Cough   Past Medical History:  Diagnosis Date   Anxiety    Chronic kidney disease    Dyslipidemia    GERD (gastroesophageal reflux disease)    History of bronchitis    History of hiatal hernia    History of palpitations    History of short term memory loss    Hypertension    Hypothyroidism    Iron deficiency anemia    OA (osteoarthritis) of knee    Left   Restless legs syndrome (RLS)    Seizures (HCC)    Myoclonic epilepsy   Urinary incontinence    Vitamin D deficiency     Patient Active Problem List   Diagnosis Date Noted   Other specified disorders of bone density and structure, left thigh 03/12/2022   Gastroesophageal reflux disease 03/03/2022   Hiatal hernia 03/03/2022   Screening for colorectal cancer 03/03/2022   Gait abnormality 01/01/2020   Memory loss 01/01/2020   Partial symptomatic epilepsy with complex partial seizures, not intractable, without status epilepticus (HCC) 02/22/2018   Obese 11/30/2017   S/P right TKA 11/28/2017   RLS (restless legs syndrome) 08/25/2016   Benign essential hypertension 10/20/2015   Dyslipidemia 10/20/2015   Heartburn 10/20/2015   Hypothyroidism 10/20/2015   Memory loss, short term 10/20/2015   Palpitations 10/20/2015   Primary osteoarthritis of left knee 10/20/2015   Vitamin D deficiency 10/20/2015    Past  Surgical History:  Procedure Laterality Date   ABDOMINAL HYSTERECTOMY     BREAST SURGERY     fatty tissue   CATARACT EXTRACTION, BILATERAL Bilateral 2022   COLONOSCOPY     JOINT REPLACEMENT Left    Knee   KNEE SURGERY     TONSILLECTOMY     TOTAL KNEE ARTHROPLASTY Right 11/28/2017   Procedure: RIGHT TOTAL KNEE ARTHROPLASTY;  Surgeon: Durene Romans, MD;  Location: [facility];  Service: Orthopedics;  Laterality: Right;  Adductor Block    OB History   No obstetric history on file.      Home Medications    Prior to Admission medications   Medication Sig Start Date End Date Taking? Authorizing Provider  albuterol (VENTOLIN HFA) 108 (90 Base) MCG/ACT inhaler Inhale 1-2 puffs into the lungs every 6 (six) hours as needed for wheezing or shortness of breath. 04/08/22  Yes , Rolly Salter E, FNP  benzonatate (TESSALON) 100 MG capsule Take 1 capsule (100 mg total) by mouth every 8 (eight) hours as needed for cough. 04/08/22  Yes , Rolly Salter E, FNP  fluticasone (FLONASE) 50 MCG/ACT nasal spray Place 1 spray into both nostrils daily for 3 days. 04/08/22 04/11/22 Yes , Acie Fredrickson, FNP  alendronate (FOSAMAX) 70 MG tablet Take 70 mg by mouth once a week.    [provider]  amLODipine (NORVASC) 5 MG tablet Take 5  mg by mouth daily.    [provider]  atorvastatin (LIPITOR) 40 MG tablet Take 40 mg by mouth daily.    [provider]  Cholecalciferol 25 MCG (1000 UT) tablet Take 1,000 Units by mouth daily.    [provider]  furosemide (LASIX) 40 MG tablet Take 40 mg by mouth daily.    [provider]  gabapentin (NEURONTIN) 100 MG capsule Take one cap in am and two caps QHS. 08/21/21   Levert Feinstein, MD  levETIRAcetam (KEPPRA) 750 MG tablet TAKE 1 TABLET (750 MG ) BY MOUTH 2 (TWO) TIMES DAILY. 07/08/21   Levert Feinstein, MD  levothyroxine (SYNTHROID) 75 MCG tablet Take 75 mcg by mouth daily before breakfast.    [provider]  memantine (NAMENDA) 10 MG tablet Take  1 tablet (10 mg total) by mouth 2 (two) times daily. 08/13/21   Glean Salvo, NP  omeprazole (PRILOSEC) 20 MG capsule Take 1 capsule (20 mg total) by mouth 2 (two) times daily before a meal. 03/03/22   Zehr, Princella Pellegrini, PA-C  omeprazole (PRILOSEC) 40 MG capsule Take 1 capsule (40 mg total) by mouth 2 (two) times daily. Take before breakfast and dinner meals. 03/19/22   Rachael Fee, MD    Family History Family History  Problem Relation Age of Onset   Colon cancer Mother    Stroke Mother        died at age 27   Diabetes Father        died at age 41   Esophageal cancer Neg Hx    Rectal cancer Neg Hx    Stomach cancer Neg Hx     Social History Social History   Tobacco Use   Smoking status: Former   Smokeless tobacco: Never  Building services engineer Use: Never used  Substance Use Topics   Alcohol use: No   Drug use: No     Allergies   Lamictal [lamotrigine], Hydrochlorothiazide, and Sulfamethoxazole   Review of Systems Review of Systems Per HPI  Physical Exam Triage Vital Signs ED Triage Vitals  Enc Vitals Group     BP 04/08/22 1351 (!) 147/85     Pulse Rate 04/08/22 1351 87     Resp 04/08/22 1351 16     Temp 04/08/22 1351 98.5 F (36.9 C)     Temp src --      SpO2 04/08/22 1351 97 %     Weight --      Height --      Head Circumference --      Peak Flow --      Pain Score 04/08/22 1350 0     Pain Loc --      Pain Edu? --      Excl. in GC? --    No data found.  Updated Vital Signs BP (!) 147/85   Pulse 87   Temp 98.5 F (36.9 C)   Resp 16   SpO2 97%   Visual Acuity Right Eye Distance:   Left Eye Distance:   Bilateral Distance:    Right Eye Near:   Left Eye Near:    Bilateral Near:     Physical Exam Constitutional:      General: She is not in acute distress.    Appearance: Normal appearance. She is not toxic-appearing or diaphoretic.  HENT:     Head: Normocephalic and atraumatic.     Right Ear: Tympanic membrane and ear canal normal.  Left Ear: Tympanic membrane and ear canal normal.     Nose: Congestion present.     Mouth/Throat:     Mouth: Mucous membranes are moist.     Pharynx: No posterior oropharyngeal erythema.  Eyes:     Extraocular Movements: Extraocular movements intact.     Conjunctiva/sclera: Conjunctivae normal.     Pupils: Pupils are equal, round, and reactive to light.  Cardiovascular:     Rate and Rhythm: Normal rate and regular rhythm.     Pulses: Normal pulses.     Heart sounds: Normal heart sounds.  Pulmonary:     Effort: Pulmonary effort is normal. No respiratory distress.     Breath sounds: Normal breath sounds. No stridor. No wheezing, rhonchi or rales.  Abdominal:     General: Abdomen is flat. Bowel sounds are normal.     Palpations: Abdomen is soft.  Musculoskeletal:        General: Normal range of motion.     Cervical back: Normal range of motion.  Skin:    General: Skin is warm and dry.  Neurological:     General: No focal deficit present.     Mental Status: She is alert and oriented to person, place, and time. Mental status is at baseline.  Psychiatric:        Mood and Affect: Mood normal.        Behavior: Behavior normal.        Thought Content: Thought content normal.        Judgment: Judgment normal.      UC Treatments / Results  Labs (all labs ordered are listed, but only abnormal results are displayed) Labs Reviewed  NOVEL CORONAVIRUS, NAA    EKG   Radiology DG Chest 2 View  Result Date: 04/08/2022 CLINICAL DATA:  Shortness of breath. EXAM: CHEST - 2 VIEW COMPARISON:  None Available. FINDINGS: The cardiac silhouette, mediastinal and hilar contours are within normal limits. There is a large hiatal hernia noted. No infiltrates, edema or effusions. No pulmonary lesions. The bony thorax is intact. IMPRESSION: No acute cardiopulmonary findings. Large hiatal hernia. Electronically Signed   By: Rudie Meyer M.D.   On: 04/08/2022 14:16    Procedures Procedures (including  critical care time)  Medications Ordered in UC Medications - No data to display  Initial Impression / Assessment and Plan / UC Course  I have reviewed the triage vital signs and the nursing notes.  Pertinent labs & imaging results that were available during my care of the patient were reviewed by me and considered in my medical decision making (see chart for details).     Patient presents with symptoms likely from a viral upper respiratory infection. Differential includes bacterial pneumonia, sinusitis, allergic rhinitis, covid 19, flu. Do not suspect underlying cardiopulmonary process. Symptoms seem unlikely related to ACS, CHF or COPD exacerbations, pneumonia, pneumothorax. Patient is nontoxic appearing and not in need of emergent medical intervention.  COVID test pending.  Chest x-ray is negative for any acute cardiopulmonary process.  It did show large hiatal hernia but patient reports that she is aware of this and has been evaluated for this.  Recommended symptom control with over the counter medications that are safe for patient.  Patient sent 2 prescriptions to help alleviate symptoms.  Albuterol inhaler also prescribed patient to use intermittently for shortness of breath and sparingly.  Vital signs and patient stable for discharge.  No signs of respiratory compromise on exam and oxygen is normal.  Return  if symptoms fail to improve. Patient states understanding and is agreeable.  Discharged with PCP followup.  Final Clinical Impressions(s) / UC Diagnoses   Final diagnoses:  Viral upper respiratory tract infection with cough     Discharge Instructions      It appears that you have a viral upper respiratory infection.  You have been sent 3 medications to help alleviate symptoms.  Albuterol inhaler has been prescribed to alleviate shortness of breath and is to be used as needed.  Please follow-up if symptoms persist or worsen.  COVID test is pending.  We will call if it is  positive.  Your chest x-ray was normal.    ED Prescriptions     Medication Sig Dispense Auth. Provider   albuterol (VENTOLIN HFA) 108 (90 Base) MCG/ACT inhaler Inhale 1-2 puffs into the lungs every 6 (six) hours as needed for wheezing or shortness of breath. 1 each [place], Rolly Salter E, FNP   fluticasone [facility]) 50 MCG/ACT nasal spray Place 1 spray into both nostrils daily for 3 days. 16 g , Rolly Salter E, [place]   benzonatate (TESSALON) 100 MG capsule Take 1 capsule (100 mg total) by mouth every 8 (eight) hours as needed for cough. 21 capsule [place], Acie Fredrickson, [place]      PDMP not reviewed this encounter.   Gustavus Bryant, [place] 04/08/22 [phone]

## 2022-04-09 ENCOUNTER — Telehealth (INDEPENDENT_AMBULATORY_CARE_PROVIDER_SITE_OTHER): Payer: Self-pay

## 2022-04-09 LAB — NOVEL CORONAVIRUS, NAA: SARS-CoV-2, NAA: NOT DETECTED

## 2022-04-09 NOTE — Telephone Encounter (Signed)
Spoke with patient DOB verified. She is aware of results. She is taking atorvastatin 40 mg daily. She verbalized understanding of results and did not have any other questions. Maryjean Morn, CMA

## 2022-04-09 NOTE — Telephone Encounter (Signed)
-----   Message from Rema Fendt, NP sent at 03/17/2022  7:38 AM EDT ----- Call patient with update.   Cholesterol slightly higher than normal. Practice heart healthy diet. Confirm if patient is taking Atorvastatin or any other cholesterol medication and if she needs refills.

## 2022-04-21 DIAGNOSIS — H524 Presbyopia: Secondary | ICD-10-CM | POA: Diagnosis not present

## 2022-04-21 DIAGNOSIS — E7800 Pure hypercholesterolemia, unspecified: Secondary | ICD-10-CM | POA: Diagnosis not present

## 2022-04-21 DIAGNOSIS — Z0100 Encounter for examination of eyes and vision without abnormal findings: Secondary | ICD-10-CM | POA: Diagnosis not present

## 2022-04-26 ENCOUNTER — Telehealth: Payer: Self-pay | Admitting: Neurology

## 2022-04-26 MED ORDER — GABAPENTIN 100 MG PO CAPS: ORAL_CAPSULE | ORAL | 3 refills | Status: DC

## 2022-04-26 NOTE — Telephone Encounter (Signed)
Pt request refill for gabapentin (NEURONTIN) 100 MG capsule at [facility]

## 2022-04-26 NOTE — Telephone Encounter (Signed)
Refill has been sent.  °

## 2022-05-04 ENCOUNTER — Ambulatory Visit: Payer: Medicare HMO | Admitting: Gastroenterology

## 2022-05-14 ENCOUNTER — Telehealth: Payer: Self-pay

## 2022-05-14 NOTE — Telephone Encounter (Signed)
Spoke to pt in regards to [facility] referral pt states that she declined it because she has CKD for many of years and she's going to check with GI at appt next week about kidneys

## 2022-05-24 ENCOUNTER — Other Ambulatory Visit: Payer: Self-pay | Admitting: Family

## 2022-05-24 DIAGNOSIS — M810 Age-related osteoporosis without current pathological fracture: Secondary | ICD-10-CM

## 2022-05-24 NOTE — Telephone Encounter (Signed)
Medication Refill - Medication: alendronate (FOSAMAX) 70 MG tablet  Has the patient contacted their pharmacy? Yes.    [place], [place] Phone:  [phone]  Fax:  [phone]     Preferred Pharmacy (with phone number or street name):  Has the patient been seen for an appointment in the last year OR does the patient have an upcoming appointment? Yes.    The patient normally takes her meds on a Wednesday and states she probably has enough for two weeks but wants to make sure she is not out because she uses mail order. Please assist patient further

## 2022-05-25 NOTE — Telephone Encounter (Signed)
Requested medication (s) are due for refill today - no  Requested medication (s) are on the active medication list -yes  Future visit scheduled -yes  Last refill: 2019  Notes to clinic: historical provider, missing labs/current scan- fails protocol   Requested Prescriptions  Pending Prescriptions Disp Refills   alendronate (FOSAMAX) 70 MG tablet      Sig: Take 1 tablet (70 mg total) by mouth once a week.     Endocrinology:  Bisphosphonates Failed - 05/24/2022  4:49 PM      Failed - Vitamin D in normal range and within 360 days    No results found for: "CM0349ZP9", "XT0569VX4", "VD125OH2TOT", "25OHVITD3", "25OHVITD2", "25OHVITD1", "VD25OH"       Failed - Cr in normal range and within 360 days    Creatinine, Ser  Date Value Ref Range Status  03/12/2022 1.43 (H) 0.57 - 1.00 mg/dL Final         Failed - Mg Level in normal range and within 360 days    No results found for: "MG"       Failed - Phosphate in normal range and within 360 days    No results found for: "PHOS"       Failed - Bone Mineral Density or Dexa Scan completed in the last 2 years      Passed - Ca in normal range and within 360 days    Calcium  Date Value Ref Range Status  03/12/2022 9.7 8.7 - 10.3 mg/dL Final         Passed - eGFR is 30 or above and within 360 days    GFR calc Af Amer  Date Value Ref Range Status  01/01/2020 61 >59 mL/min/1.73 Final    Comment:    **[facility] currently reports eGFR in compliance with the current**   recommendations of the [employer]. [facility] will   update reporting as new guidelines are published from the NKF-ASN   Task force.    GFR calc non Af Amer  Date Value Ref Range Status  01/01/2020 53 (L) >59 mL/min/1.73 Final   eGFR  Date Value Ref Range Status  03/12/2022 37 (L) >59 mL/min/1.73 Final         Passed - Valid encounter within last 12 months    Recent Outpatient Visits           5 months ago Encounter to establish care   Primary Care at  [facility], Amy J, NP       Future Appointments             In 2 weeks Camillia Herter, NP Primary Care at [facility]               Requested Prescriptions  Pending Prescriptions Disp Refills   alendronate (FOSAMAX) 70 MG tablet      Sig: Take 1 tablet (70 mg total) by mouth once a week.     Endocrinology:  Bisphosphonates Failed - 05/24/2022  4:49 PM      Failed - Vitamin D in normal range and within 360 days    No results found for: "IA1655VZ4", "MO7078ML5", "VD125OH2TOT", "25OHVITD3", "25OHVITD2", "25OHVITD1", "VD25OH"       Failed - Cr in normal range and within 360 days    Creatinine, Ser  Date Value Ref Range Status  03/12/2022 1.43 (H) 0.57 - 1.00 mg/dL Final         Failed - Mg Level in normal range and within 360 days  No results found for: "MG"       Failed - Phosphate in normal range and within 360 days    No results found for: "PHOS"       Failed - Bone Mineral Density or Dexa Scan completed in the last 2 years      Passed - Ca in normal range and within 360 days    Calcium  Date Value Ref Range Status  03/12/2022 9.7 8.7 - 10.3 mg/dL Final         Passed - eGFR is 30 or above and within 360 days    GFR calc Af Amer  Date Value Ref Range Status  01/01/2020 61 >59 mL/min/1.73 Final    Comment:    **[facility] currently reports eGFR in compliance with the current**   recommendations of the [employer]. [facility] will   update reporting as new guidelines are published from the NKF-ASN   Task force.    GFR calc non Af Amer  Date Value Ref Range Status  01/01/2020 53 (L) >59 mL/min/1.73 Final   eGFR  Date Value Ref Range Status  03/12/2022 37 (L) >59 mL/min/1.73 Final         Passed - Valid encounter within last 12 months    Recent Outpatient Visits           5 months ago Encounter to establish care   Primary Care at [facility], Flonnie Hailstone, NP       Future Appointments             In 2 weeks  Camillia Herter, NP Primary Care at [facility]

## 2022-05-28 ENCOUNTER — Telehealth: Payer: Self-pay | Admitting: Family

## 2022-05-28 ENCOUNTER — Ambulatory Visit: Payer: Medicare HMO | Admitting: Physician Assistant

## 2022-05-28 ENCOUNTER — Encounter: Payer: Self-pay | Admitting: Physician Assistant

## 2022-05-28 VITALS — BP 110/70 | HR 75 | Ht 63.0 in | Wt 198.4 lb

## 2022-05-28 DIAGNOSIS — K449 Diaphragmatic hernia without obstruction or gangrene: Secondary | ICD-10-CM

## 2022-05-28 DIAGNOSIS — K219 Gastro-esophageal reflux disease without esophagitis: Secondary | ICD-10-CM | POA: Diagnosis not present

## 2022-05-28 MED ORDER — OMEPRAZOLE 40 MG PO CPDR: 40.0000 mg | DELAYED_RELEASE_CAPSULE | Freq: Every day | ORAL | 3 refills | Status: DC

## 2022-05-28 MED ORDER — ALENDRONATE SODIUM 70 MG PO TABS: 70.0000 mg | ORAL_TABLET | ORAL | 2 refills | Status: DC

## 2022-05-28 NOTE — Progress Notes (Signed)
Attending Physician's Attestation   I have reviewed the chart.   I agree with the Advanced Practitioner's note, impression, and recommendations with any updates as below.    Rakesha Dalporto Mansouraty, MD  Gastroenterology Advanced Endoscopy Office # [phone]  

## 2022-05-28 NOTE — Telephone Encounter (Signed)
Asked if PCP could refill

## 2022-05-28 NOTE — Telephone Encounter (Signed)
Order complete. 

## 2022-05-28 NOTE — Progress Notes (Signed)
Chief Complaint: Follow-up EGD for dyspepsia and heartburn  HPI:    Robin Horton is an 81 year old female with a past medical history as listed below, known to Dr. Christella Hartigan, who returns to clinic today for follow-up after recent EGD for dyspepsia and heartburn.    03/12/2022 CMP with a creatinine of 1.43.  (1.02 on 01/01/2020).    03/19/2022 EGD with large hiatal hernia, suspected about 75% of her stomach was intrathoracic, this caused the usual esophageal tortuosity and foreshortening, otherwise normal.  At that time patient given Omeprazole 40 mg, 1 pill twice daily before breakfast and dinner.    03/22/2022 Cologuard negative.    03/30/2022 patient called in to discuss that she thought Prilosec was causing constipation.  That time recommended she stay on Omeprazole and use MiraLAX as needed.    04/08/2022 chest x-ray for shortness of breath with large hiatal hernia.    Today, the patient tells me she is doing very well.  She finished her Omeprazole 20 mg daily and now is moved to 40 mg once a day.  She tells me that controls her symptoms well.  She has a few questions about a hiatal hernia but otherwise denies any new complaints or concerns.    Fever, chills, weight loss, heartburn, reflux, abdominal or chest pain.  Past Medical History:  Diagnosis Date   Anxiety    Chronic kidney disease    Dyslipidemia    GERD (gastroesophageal reflux disease)    History of bronchitis    History of hiatal hernia    History of palpitations    History of short term memory loss    Hypertension    Hypothyroidism    Iron deficiency anemia    OA (osteoarthritis) of knee    Left   Restless legs syndrome (RLS)    Seizures (HCC)    Myoclonic epilepsy   Urinary incontinence    Vitamin D deficiency     Past Surgical History:  Procedure Laterality Date   ABDOMINAL HYSTERECTOMY     BREAST SURGERY     fatty tissue   CATARACT EXTRACTION, BILATERAL Bilateral 2022   COLONOSCOPY     JOINT REPLACEMENT Left    Knee    KNEE SURGERY     TONSILLECTOMY     TOTAL KNEE ARTHROPLASTY Right 11/28/2017   Procedure: RIGHT TOTAL KNEE ARTHROPLASTY;  Surgeon: Durene Romans, MD;  Location: [facility];  Service: Orthopedics;  Laterality: Right;  Adductor Block    Current Outpatient Medications  Medication Sig Dispense Refill   albuterol (VENTOLIN HFA) 108 (90 Base) MCG/ACT inhaler Inhale 1-2 puffs into the lungs every 6 (six) hours as needed for wheezing or shortness of breath. 1 each 0   alendronate (FOSAMAX) 70 MG tablet Take 70 mg by mouth once a week.     amLODipine (NORVASC) 5 MG tablet Take 5 mg by mouth daily.     atorvastatin (LIPITOR) 40 MG tablet Take 40 mg by mouth daily.     benzonatate (TESSALON) 100 MG capsule Take 1 capsule (100 mg total) by mouth every 8 (eight) hours as needed for cough. 21 capsule 0   Cholecalciferol 25 MCG (1000 UT) tablet Take 1,000 Units by mouth daily.     fluticasone (FLONASE) 50 MCG/ACT nasal spray Place 1 spray into both nostrils daily for 3 days. 16 g 0   furosemide (LASIX) 40 MG tablet Take 40 mg by mouth daily.     gabapentin (NEURONTIN) 100 MG capsule Take one cap in  am and two caps QHS. 270 capsule 3   levETIRAcetam (KEPPRA) 750 MG tablet TAKE 1 TABLET (750 MG ) BY MOUTH 2 (TWO) TIMES DAILY. 180 tablet 4   levothyroxine (SYNTHROID) 75 MCG tablet Take 75 mcg by mouth daily before breakfast.     memantine (NAMENDA) 10 MG tablet Take 1 tablet (10 mg total) by mouth 2 (two) times daily. 180 tablet 3   omeprazole (PRILOSEC) 20 MG capsule Take 1 capsule (20 mg total) by mouth 2 (two) times daily before a meal. 60 capsule 3   omeprazole (PRILOSEC) 40 MG capsule Take 1 capsule (40 mg total) by mouth 2 (two) times daily. Take before breakfast and dinner meals. 180 capsule 3   No current facility-administered medications for this visit.    Allergies as of 05/28/2022 - Review Complete 04/08/2022  Allergen Reaction Noted   Lamictal [lamotrigine] Rash 03/07/2018   Hydrochlorothiazide  Other (See Comments) 10/20/2015   Sulfamethoxazole Rash 10/20/2015    Family History  Problem Relation Age of Onset   Colon cancer Mother    Stroke Mother        died at age 45   Diabetes Father        died at age 20   Esophageal cancer Neg Hx    Rectal cancer Neg Hx    Stomach cancer Neg Hx     Social History   Socioeconomic History   Marital status: Widowed    Spouse name: Not on file   Number of children: 4   Years of education: 12   Highest education level: High school graduate  Occupational History   Occupation: Retired  Tobacco Use   Smoking status: Former   Smokeless tobacco: Never  Scientific laboratory technician Use: Never used  Substance and Sexual Activity   Alcohol use: No   Drug use: No   Sexual activity: Not on file  Other Topics Concern   Not on file  Social History Narrative   Lives alone.   Right-handed.   2 cups caffeine per day.   Social Determinants of Health   Financial Resource Strain: Not on file  Food Insecurity: Not on file  Transportation Needs: Not on file  Physical Activity: Not on file  Stress: Not on file  Social Connections: Not on file  Intimate Partner Violence: Not on file    Review of Systems:    Constitutional: No weight loss, fever or chills Cardiovascular: No chest pain Respiratory: No SOB  Gastrointestinal: See HPI and otherwise negative   Physical Exam:  Vital signs: BP 110/70   Pulse 75   Ht 5\' 3"  (1.6 m)   Wt 198 lb 6 oz (90 kg)   BMI 35.14 kg/m    Constitutional:   Pleasant Elderly Caucasian female appears to be in NAD, Well developed, Well nourished, alert and cooperative Respiratory: Respirations even and unlabored. Lungs clear to auscultation bilaterally.   No wheezes, crackles, or rhonchi.  Cardiovascular: Normal S1, S2. No MRG. Regular rate and rhythm. No peripheral edema, cyanosis or pallor.  Gastrointestinal:  Soft, nondistended, nontender. No rebound or guarding. Normal bowel sounds. No appreciable masses or  hepatomegaly. Rectal:  Not performed.  Msk:  Symmetrical without gross deformities. Without edema, no deformity or joint abnormality. +ambulates with cane Psychiatric: Oriented to person, place and time. Demonstrates good judgement and reason without abnormal affect or behaviors.  RELEVANT LABS AND IMAGING: CBC    Component Value Date/Time   WBC 7.1 03/12/2022 1326  WBC 12.2 (H) 11/30/2017 0602   RBC 4.52 03/12/2022 1326   RBC 3.54 (L) 11/30/2017 0602   HGB 13.7 03/12/2022 1326   HCT 40.6 03/12/2022 1326   PLT 221 03/12/2022 1326   MCV 90 03/12/2022 1326   MCH 30.3 03/12/2022 1326   MCH 31.6 11/30/2017 0602   MCHC 33.7 03/12/2022 1326   MCHC 33.1 11/30/2017 0602   RDW 13.1 03/12/2022 1326   LYMPHSABS 2.1 01/01/2020 1000   EOSABS 0.3 01/01/2020 1000   BASOSABS 0.1 01/01/2020 1000    CMP     Component Value Date/Time   NA 142 03/12/2022 1326   K 4.4 03/12/2022 1326   CL 102 03/12/2022 1326   CO2 21 03/12/2022 1326   GLUCOSE 88 03/12/2022 1326   GLUCOSE 97 11/30/2017 0602   BUN 21 03/12/2022 1326   CREATININE 1.43 (H) 03/12/2022 1326   CALCIUM 9.7 03/12/2022 1326   PROT 6.7 03/12/2022 1326   ALBUMIN 4.3 03/12/2022 1326   AST 29 03/12/2022 1326   ALT 18 03/12/2022 1326   ALKPHOS 98 03/12/2022 1326   BILITOT 0.5 03/12/2022 1326   GFRNONAA 53 (L) 01/01/2020 1000   GFRAA 61 01/01/2020 1000    Assessment: 1.  GERD: With below 2.  Large hiatal hernia: 75% of her stomach was in her intrathoracic cavity on last EGD in July, no symptoms at this time on Omeprazole 40 mg daily  Plan: 1.  Reviewed recent EGD.  Discussed her hiatal hernia.  Did discuss that sometimes this can cause emergent symptoms including chest pain/pressure, shortness of breath, nausea, vomiting if he were to get twisted.  At that point she would need to go to the ER. 2.  For now continue Omeprazole 40 mg daily as it controls her symptoms well.  #90, 3 refills sent to her mail order pharmacy per  request 3.  Patient to follow in clinic with [place] as needed.  If she needs refills in a year and is doing well she can have them, would need to come back and see [place] in 2 years if not seen before then. 4. Sending chart to Dr. Meridee Score in Dr. Christella Hartigan abscence  Hyacinth Meeker, PA-C [facility] 05/28/2022, 10:57 AM  Cc: Rema Fendt, NP

## 2022-05-28 NOTE — Patient Instructions (Signed)
If you are age 81 or older, your body mass index should be between 23-30. Your Body mass index is 35.14 kg/m. If this is out of the aforementioned range listed, please consider follow up with your Primary Care Provider.  If you are age 71 or younger, your body mass index should be between 19-25. Your Body mass index is 35.14 kg/m. If this is out of the aformentioned range listed, please consider follow up with your Primary Care Provider.   ________________________________________________________  The [facility] providers would like to encourage you to use [facility] to communicate with providers for non-urgent requests or questions.  Due to long hold times on the telephone, sending your provider a message by [facility] may be a faster and more efficient way to get a response.  Please allow 48 business hours for a response.  Please remember that this is for non-urgent requests.  _______________________________________________________   We have sent the following medications to your pharmacy for you to pick up at your convenience: Omeprazole 40 mg (take 1 tablet daily)  Follow up as needed.   It was a pleasure to see you today!  Thank you for trusting me with your gastrointestinal care!

## 2022-05-30 ENCOUNTER — Other Ambulatory Visit: Payer: Self-pay | Admitting: Gastroenterology

## 2022-05-31 ENCOUNTER — Other Ambulatory Visit: Payer: Self-pay

## 2022-05-31 DIAGNOSIS — K219 Gastro-esophageal reflux disease without esophagitis: Secondary | ICD-10-CM

## 2022-05-31 MED ORDER — OMEPRAZOLE 40 MG PO CPDR: 40.0000 mg | DELAYED_RELEASE_CAPSULE | Freq: Every day | ORAL | 3 refills | Status: DC

## 2022-05-31 NOTE — Telephone Encounter (Signed)
[facility] needed clarification on Robin Horton omeprazole sig. I have looked at Robin Horton last visit and resent the omeprazole for once daily of the 40mg .

## 2022-06-03 NOTE — Progress Notes (Signed)
Patient ID: Robin Horton, female    DOB: 03-29-1941  MRN: [id number]  CC: Medication Refill   Subjective: Robin Horton is a 81 y.o. female who presents for medication refill.   Her concerns today include:  Patient states she takes Fosamax for osteoporosis and due for refills which she has already received from pharmacy. States she initially declined referral to [place] because she had chronic kidney disease for many years. Today states she will call [place] to see if she can schedule an appointment.   Patient Active Problem List   Diagnosis Date Noted   Other specified disorders of bone density and structure, left thigh 03/12/2022   Gastroesophageal reflux disease 03/03/2022   Hiatal hernia 03/03/2022   Screening for colorectal cancer 03/03/2022   Gait abnormality 01/01/2020   Memory loss 01/01/2020   Partial symptomatic epilepsy with complex partial seizures, not intractable, without status epilepticus ([place]) 02/22/2018   Obese 11/30/2017   S/P right TKA 11/28/2017   RLS (restless legs syndrome) 08/25/2016   Benign essential hypertension 10/20/2015   Dyslipidemia 10/20/2015   Heartburn 10/20/2015   Hypothyroidism 10/20/2015   Memory loss, short term 10/20/2015   Palpitations 10/20/2015   Primary osteoarthritis of left knee 10/20/2015   Vitamin D deficiency 10/20/2015     Current Outpatient Medications on File Prior to Visit  Medication Sig Dispense Refill   alendronate (FOSAMAX) 70 MG tablet Take 1 tablet (70 mg total) by mouth once a week. 4 tablet 2   amLODipine (NORVASC) 5 MG tablet Take 5 mg by mouth daily.     atorvastatin (LIPITOR) 40 MG tablet Take 40 mg by mouth daily.     Cholecalciferol 25 MCG (1000 UT) tablet Take 1,000 Units by mouth daily.     furosemide (LASIX) 40 MG tablet Take 40 mg by mouth daily.     gabapentin (NEURONTIN) 100 MG capsule Take one cap in am and two caps QHS. 270 capsule 3   levETIRAcetam (KEPPRA) 750  MG tablet TAKE 1 TABLET (750 MG ) BY MOUTH 2 (TWO) TIMES DAILY. 180 tablet 4   levothyroxine (SYNTHROID) 75 MCG tablet Take 75 mcg by mouth daily before breakfast.     memantine (NAMENDA) 10 MG tablet Take 1 tablet (10 mg total) by mouth 2 (two) times daily. 180 tablet 3   omeprazole (PRILOSEC) 40 MG capsule Take 1 capsule (40 mg total) by mouth daily. 90 capsule 3   No current facility-administered medications on file prior to visit.    Allergies  Allergen Reactions   Lamictal [Lamotrigine] Rash   Hydrochlorothiazide Other (See Comments)    Other reaction(s): Other Renal insufficiency Renal insufficiency    Sulfamethoxazole Rash    Social History   Socioeconomic History   Marital status: Widowed    Spouse name: Not on file   Number of children: 4   Years of education: 12   Highest education level: High school graduate  Occupational History   Occupation: Retired  Tobacco Use   Smoking status: Former    Passive exposure: Past   Smokeless tobacco: Never  Scientific laboratory technician Use: Never used  Substance and Sexual Activity   Alcohol use: No   Drug use: No   Sexual activity: Not on file  Other Topics Concern   Not on file  Social History Narrative   Lives alone.   Right-handed.   2 cups caffeine per day.   Social Determinants of Health   Financial  Resource Strain: Not on file  Food Insecurity: Not on file  Transportation Needs: Not on file  Physical Activity: Not on file  Stress: Not on file  Social Connections: Not on file  Intimate Partner Violence: Not on file    Family History  Problem Relation Age of Onset   Colon cancer Mother    Stroke Mother        died at age 36   Diabetes Father        died at age 57   Esophageal cancer Neg Hx    Rectal cancer Neg Hx    Stomach cancer Neg Hx     Past Surgical History:  Procedure Laterality Date   ABDOMINAL HYSTERECTOMY     BREAST SURGERY     fatty tissue   CATARACT EXTRACTION, BILATERAL Bilateral 2022    COLONOSCOPY     JOINT REPLACEMENT Left    Knee   KNEE SURGERY     TONSILLECTOMY     TOTAL KNEE ARTHROPLASTY Right 11/28/2017   Procedure: RIGHT TOTAL KNEE ARTHROPLASTY;  Surgeon: Paralee Cancel, MD;  Location: [facility];  Service: Orthopedics;  Laterality: Right;  Adductor Block    ROS: Review of Systems Negative except as stated above  PHYSICAL EXAM: BP 125/80 (BP Location: Left Arm, Patient Position: Sitting, Cuff Size: Large)   Pulse 73   Temp 98.3 F (36.8 C)   Resp 16   Ht 5' 2.99" (1.6 m)   Wt 198 lb (89.8 kg)   SpO2 98%   BMI 35.08 kg/m   Physical Exam HENT:     Head: Normocephalic and atraumatic.  Eyes:     Extraocular Movements: Extraocular movements intact.     Conjunctiva/sclera: Conjunctivae normal.     Pupils: Pupils are equal, round, and reactive to light.  Cardiovascular:     Rate and Rhythm: Normal rate and regular rhythm.     Pulses: Normal pulses.     Heart sounds: Normal heart sounds.  Pulmonary:     Effort: Pulmonary effort is normal.     Breath sounds: Normal breath sounds.  Musculoskeletal:     Cervical back: Normal range of motion and neck supple.  Neurological:     General: No focal deficit present.     Mental Status: She is alert and oriented to person, place, and time.  Psychiatric:        Mood and Affect: Mood normal.        Behavior: Behavior normal.     ASSESSMENT AND PLAN: 1. Osteoporosis, unspecified osteoporosis type, unspecified pathological fracture presence 2. History of osteopenia - Continue Alendronate as prescribed. No refills needed as of present.  - DG bone density ordered for further evaluation of osteoporosis.  - Referral to Rheumatology for further evaluation and management. - Ambulatory referral to Rheumatology - DG Bone Density; Future  3. Stage 3b chronic kidney disease ([place]) - Patient provided with contact information to [employer] [employer]. She is aware to notify me if she is unable to successfully schedule  an appointment.   Patient was given the opportunity to ask questions.  Patient verbalized understanding of the plan and was able to repeat key elements of the plan. Patient was given clear instructions to go to Emergency Department or return to medical center if symptoms don't improve, worsen, or new problems develop.The patient verbalized understanding.   Orders Placed This Encounter  Procedures   DG Bone Density   Ambulatory referral to Rheumatology    Follow-up with primary  provider as scheduled.   Camillia Herter, NP

## 2022-06-10 ENCOUNTER — Telehealth: Payer: Self-pay | Admitting: Family

## 2022-06-10 ENCOUNTER — Ambulatory Visit (INDEPENDENT_AMBULATORY_CARE_PROVIDER_SITE_OTHER): Payer: Medicare HMO | Admitting: Family

## 2022-06-10 ENCOUNTER — Encounter: Payer: Self-pay | Admitting: Family

## 2022-06-10 VITALS — BP 125/80 | HR 73 | Temp 98.3°F | Resp 16 | Ht 62.99 in | Wt 198.0 lb

## 2022-06-10 DIAGNOSIS — N1832 Chronic kidney disease, stage 3b: Secondary | ICD-10-CM

## 2022-06-10 DIAGNOSIS — M810 Age-related osteoporosis without current pathological fracture: Secondary | ICD-10-CM

## 2022-06-10 DIAGNOSIS — Z8739 Personal history of other diseases of the musculoskeletal system and connective tissue: Secondary | ICD-10-CM

## 2022-06-10 NOTE — Telephone Encounter (Signed)
Order complete. 

## 2022-06-10 NOTE — Telephone Encounter (Signed)
Patient would like bone density orders sent to [facility] phone # [phone]. Patient would like a follow up call when orders have been placed

## 2022-06-10 NOTE — Progress Notes (Signed)
Pt presents for follow-up -Fosamax refilled on 05/28/22 -declined referral to [facility] when was contacted, advised that I will give contact info and she can f/u w/them to confirm if new referral needed

## 2022-06-17 ENCOUNTER — Telehealth: Payer: Self-pay | Admitting: Family

## 2022-06-17 NOTE — Telephone Encounter (Signed)
Copied from [place] [id number]. Topic: General - Other >> Jun 17, 2022 12:21 PM Sabas Sous wrote: Reason for CRM: Pt called and is requesting to have her Bone Density order sent elsewhere that can see her sooner, [facility] cannot see her until March.

## 2022-06-18 ENCOUNTER — Telehealth: Payer: Self-pay | Admitting: Family

## 2022-06-18 NOTE — Telephone Encounter (Signed)
Copied from [place] [id number]. Topic: General - Other >> Jun 18, 2022  4:10 PM Erskine Squibb wrote: Reason for CRM: Junita Push with [facility] called in stating she needs for the patients provider to go in the system and cosign the bone density order as soon as possible so she can have the Bone Density on Monday. Please assist further

## 2022-06-19 NOTE — Telephone Encounter (Signed)
Order signed.

## 2022-06-21 ENCOUNTER — Ambulatory Visit
Admission: RE | Admit: 2022-06-21 | Discharge: 2022-06-21 | Disposition: A | Discharge status: Home / Self Care | Payer: Medicare HMO | Source: Ambulatory Visit | Attending: Family | Admitting: Family

## 2022-06-21 DIAGNOSIS — Z8739 Personal history of other diseases of the musculoskeletal system and connective tissue: Secondary | ICD-10-CM

## 2022-06-21 DIAGNOSIS — Z780 Asymptomatic menopausal state: Secondary | ICD-10-CM | POA: Diagnosis not present

## 2022-06-21 DIAGNOSIS — M8589 Other specified disorders of bone density and structure, multiple sites: Secondary | ICD-10-CM | POA: Diagnosis not present

## 2022-06-21 DIAGNOSIS — M810 Age-related osteoporosis without current pathological fracture: Secondary | ICD-10-CM

## 2022-06-28 ENCOUNTER — Telehealth: Payer: Self-pay | Admitting: Family

## 2022-06-28 NOTE — Telephone Encounter (Signed)
Pt was given Bone density results on 10/19 along with Rheumatologist referral info.

## 2022-06-28 NOTE — Telephone Encounter (Signed)
Pt is calling to receive results for DG Bone Density

## 2022-07-23 ENCOUNTER — Other Ambulatory Visit: Payer: Self-pay | Admitting: Family

## 2022-07-23 MED ORDER — ATORVASTATIN CALCIUM 40 MG PO TABS: 40.0000 mg | ORAL_TABLET | Freq: Every day | ORAL | 2 refills | Status: DC

## 2022-07-23 MED ORDER — AMLODIPINE BESYLATE 5 MG PO TABS: 5.0000 mg | ORAL_TABLET | Freq: Every day | ORAL | 2 refills | Status: DC

## 2022-07-23 NOTE — Telephone Encounter (Signed)
Medication Refill - Medication: amLODipine (NORVASC) 5 MG tablet [[id number]]    atorvastatin (LIPITOR) 40 MG tablet [[id number]]   Has the patient contacted their pharmacy? Yes.   (Agent: If no, request that the patient contact the pharmacy for the refill. If patient does not wish to contact the pharmacy document the reason why and proceed with request.) (Agent: If yes, when and what did the pharmacy advise?)  Preferred Pharmacy (with phone number or street name):  [facility] - [place], [place] - [address]  [address] [place] [address]  Phone: [phone] Fax: [phone]  Hours: Not open 24 hours   Has the patient been seen for an appointment in the last year OR does the patient have an upcoming appointment? Yes.    Agent: Please be advised that RX refills may take up to 3 business days. We ask that you follow-up with your pharmacy.

## 2022-07-23 NOTE — Telephone Encounter (Signed)
Requested medication (s) are due for refill today: routing for review  Requested medication (s) are on the active medication list: yes  Last refill:  11/11/17  Future visit scheduled: no  Notes to clinic:  Unable to refill per protocol, last refill by another provider. Routing for review.     Requested Prescriptions  Pending Prescriptions Disp Refills   amLODipine (NORVASC) 5 MG tablet      Sig: Take 1 tablet (5 mg total) by mouth daily.     Cardiovascular: Calcium Channel Blockers 2 Passed - 07/23/2022  9:42 AM      Passed - Last BP in normal range    BP Readings from Last 1 Encounters:  06/10/22 125/80         Passed - Last Heart Rate in normal range    Pulse Readings from Last 1 Encounters:  06/10/22 73         Passed - Valid encounter within last 6 months    Recent Outpatient Visits           1 month ago Osteoporosis, unspecified osteoporosis type, unspecified pathological fracture presence   Primary Care at [facility], Amy J, NP   7 months ago Encounter to establish care   Primary Care at [facility], Amy J, NP               atorvastatin (LIPITOR) 40 MG tablet      Sig: Take 1 tablet (40 mg total) by mouth daily.     Cardiovascular:  Antilipid - Statins Failed - 07/23/2022  9:42 AM      Failed - Lipid Panel in normal range within the last 12 months    Cholesterol, Total  Date Value Ref Range Status  03/16/2022 214 (H) 100 - 199 mg/dL Final   LDL Chol Calc (NIH)  Date Value Ref Range Status  03/16/2022 100 (H) 0 - 99 mg/dL Final   HDL  Date Value Ref Range Status  03/16/2022 89 >39 mg/dL Final   Triglycerides  Date Value Ref Range Status  03/16/2022 146 0 - 149 mg/dL Final         Passed - Patient is not pregnant      Passed - Valid encounter within last 12 months    Recent Outpatient Visits           1 month ago Osteoporosis, unspecified osteoporosis type, unspecified pathological fracture presence   Primary Care  at [facility], Amy J, NP   7 months ago Encounter to establish care   Primary Care at [facility], Salomon Fick, NP

## 2022-07-23 NOTE — Telephone Encounter (Signed)
Order complete. 

## 2022-08-03 DIAGNOSIS — N2581 Secondary hyperparathyroidism of renal origin: Secondary | ICD-10-CM | POA: Diagnosis not present

## 2022-08-03 DIAGNOSIS — R609 Edema, unspecified: Secondary | ICD-10-CM | POA: Diagnosis not present

## 2022-08-03 DIAGNOSIS — D631 Anemia in chronic kidney disease: Secondary | ICD-10-CM | POA: Diagnosis not present

## 2022-08-03 DIAGNOSIS — N1832 Chronic kidney disease, stage 3b: Secondary | ICD-10-CM | POA: Diagnosis not present

## 2022-08-03 DIAGNOSIS — I129 Hypertensive chronic kidney disease with stage 1 through stage 4 chronic kidney disease, or unspecified chronic kidney disease: Secondary | ICD-10-CM | POA: Diagnosis not present

## 2022-08-11 ENCOUNTER — Other Ambulatory Visit: Payer: Self-pay | Admitting: Family

## 2022-08-11 DIAGNOSIS — M810 Age-related osteoporosis without current pathological fracture: Secondary | ICD-10-CM

## 2022-08-12 ENCOUNTER — Encounter: Payer: Self-pay | Admitting: Family

## 2022-08-17 ENCOUNTER — Encounter: Payer: Self-pay | Admitting: Neurology

## 2022-08-17 ENCOUNTER — Ambulatory Visit: Payer: Medicare HMO | Admitting: Neurology

## 2022-08-17 VITALS — BP 153/87 | HR 74 | Ht 64.0 in | Wt 197.5 lb

## 2022-08-17 DIAGNOSIS — R413 Other amnesia: Secondary | ICD-10-CM | POA: Diagnosis not present

## 2022-08-17 DIAGNOSIS — G40209 Localization-related (focal) (partial) symptomatic epilepsy and epileptic syndromes with complex partial seizures, not intractable, without status epilepticus: Secondary | ICD-10-CM | POA: Diagnosis not present

## 2022-08-17 DIAGNOSIS — G2581 Restless legs syndrome: Secondary | ICD-10-CM

## 2022-08-17 MED ORDER — GABAPENTIN 100 MG PO CAPS: ORAL_CAPSULE | ORAL | 3 refills | Status: DC

## 2022-08-17 MED ORDER — MEMANTINE HCL 10 MG PO TABS: 10.0000 mg | ORAL_TABLET | Freq: Two times a day (BID) | ORAL | 3 refills | Status: DC

## 2022-08-17 MED ORDER — LEVETIRACETAM 500 MG PO TABS: 500.0000 mg | ORAL_TABLET | Freq: Two times a day (BID) | ORAL | 11 refills | Status: DC
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
## 2022-08-17 NOTE — Patient Instructions (Signed)
I will order Keppra 500 mg twice daily  Check EEG  Continue the Namenda, gabapentin Call for any seizures See you back in 6 months

## 2022-08-18 DIAGNOSIS — R93422 Abnormal radiologic findings on diagnostic imaging of left kidney: Secondary | ICD-10-CM | POA: Diagnosis not present

## 2022-08-18 DIAGNOSIS — N189 Chronic kidney disease, unspecified: Secondary | ICD-10-CM | POA: Diagnosis not present

## 2022-08-18 DIAGNOSIS — N281 Cyst of kidney, acquired: Secondary | ICD-10-CM | POA: Diagnosis not present

## 2022-09-16 ENCOUNTER — Other Ambulatory Visit: Payer: Medicare HMO | Admitting: *Deleted

## 2022-09-20 ENCOUNTER — Ambulatory Visit: Payer: Self-pay

## 2022-09-20 NOTE — Telephone Encounter (Signed)
     Chief Complaint: Cough, congestion Symptoms: Above Frequency: Last Thursday Pertinent Negatives: Patient denies fever Disposition: [] ED /[] Urgent Care (no appt availability in office) / [] Appointment(In office/virtual)/ []  [facility]/ [x] Home Care/ [] Refused Recommended Disposition /[] [facility]/ [x]  Follow-up with PCP Additional Notes: Reviewed home care,will call back if no better.  Answer Assessment - Initial Assessment Questions 1. ONSET: "When did the cough begin?"      Thursday 2. SEVERITY: "How bad is the cough today?"      Moderate 3. SPUTUM: "Describe the color of your sputum" (none, dry cough; clear, white, yellow, green)     None 4. HEMOPTYSIS: "Are you coughing up any blood?" If so ask: "How much?" (flecks, streaks, tablespoons, etc.)     No 5. DIFFICULTY BREATHING: "Are you having difficulty breathing?" If Yes, ask: "How bad is it?" (e.g., mild, moderate, severe)    - MILD: No SOB at rest, mild SOB with walking, speaks normally in sentences, can lie down, no retractions, pulse < 100.    - MODERATE: SOB at rest, SOB with minimal exertion and prefers to sit, cannot lie down flat, speaks in phrases, mild retractions, audible wheezing, pulse 100-120.    - SEVERE: Very SOB at rest, speaks in single words, struggling to breathe, sitting hunched forward, retractions, pulse > 120      No 6. FEVER: "Do you have a fever?" If Yes, ask: "What is your temperature, how was it measured, and when did it start?"     No 7. CARDIAC HISTORY: "Do you have any history of heart disease?" (e.g., heart attack, congestive heart failure)      No 8. LUNG HISTORY: "Do you have any history of lung disease?"  (e.g., pulmonary embolus, asthma, emphysema)     No 9. PE RISK FACTORS: "Do you have a history of blood clots?" (or: recent major surgery, recent prolonged travel, bedridden)     No 10. OTHER SYMPTOMS: "Do you have any other symptoms?" (e.g., runny nose, wheezing,  chest pain)       No 11. PREGNANCY: "Is there any chance you are pregnant?" "When was your last menstrual period?"       NO 12. TRAVEL: "Have you traveled out of the country in the last month?" (e.g., travel history, exposures)       No  Protocols used: Cough - Acute Non-Productive-A-AH

## 2022-09-20 NOTE — Telephone Encounter (Signed)
Message from Roslynn Amble sent at 09/20/2022  9:24 AM EST  Summary: Cough, congestion   The patient called in stating she has been feeling bad since Thursday. It started with one eye that was watery. She now has congestion, cough, and runny nose. She is concerned because she is around her grandchild a lot and wants to make sure she is ok. She has an nasal inhaler that she is currently using and she has taken a few puffs down her throat from another inhaler.  She says she is not short of breath but she was hoping it would help. Please assist patient further.         Called [facility].

## 2022-10-08 ENCOUNTER — Other Ambulatory Visit: Payer: Self-pay | Admitting: Family

## 2022-10-08 NOTE — Telephone Encounter (Signed)
Requested Prescriptions  Pending Prescriptions Disp Refills   atorvastatin (LIPITOR) 40 MG tablet [Pharmacy Med Name: ATORVASTATIN CALCIUM 40 MG Tablet] 90 tablet 1    Sig: TAKE 1 TABLET EVERY DAY     Cardiovascular:  Antilipid - Statins Failed - 10/08/2022 10:55 AM      Failed - Lipid Panel in normal range within the last 12 months    Cholesterol, Total  Date Value Ref Range Status  03/16/2022 214 (H) 100 - 199 mg/dL Final   LDL Chol Calc (NIH)  Date Value Ref Range Status  03/16/2022 100 (H) 0 - 99 mg/dL Final   HDL  Date Value Ref Range Status  03/16/2022 89 >39 mg/dL Final   Triglycerides  Date Value Ref Range Status  03/16/2022 146 0 - 149 mg/dL Final         Passed - Patient is not pregnant      Passed - Valid encounter within last 12 months    Recent Outpatient Visits           4 months ago Osteoporosis, unspecified osteoporosis type, unspecified pathological fracture presence   [facility], Amy J, NP   9 months ago Encounter to establish care   [facility], Amy J, NP               amLODipine (NORVASC) 5 MG tablet [Pharmacy Med Name: AMLODIPINE BESYLATE 5 MG Tablet] 90 tablet 0    Sig: TAKE 1 TABLET EVERY DAY     Cardiovascular: Calcium Channel Blockers 2 Failed - 10/08/2022 10:55 AM      Failed - Last BP in normal range    BP Readings from Last 1 Encounters:  08/17/22 (!) 153/87         Passed - Last Heart Rate in normal range    Pulse Readings from Last 1 Encounters:  08/17/22 74         Passed - Valid encounter within last 6 months    Recent Outpatient Visits           4 months ago Osteoporosis, unspecified osteoporosis type, unspecified pathological fracture presence   [facility], Amy J, NP   9 months ago Encounter to establish care   [facility], Flonnie Hailstone, NP

## 2022-10-20 ENCOUNTER — Ambulatory Visit: Payer: Medicare HMO | Admitting: Neurology

## 2022-10-20 DIAGNOSIS — G40209 Localization-related (focal) (partial) symptomatic epilepsy and epileptic syndromes with complex partial seizures, not intractable, without status epilepticus: Secondary | ICD-10-CM

## 2022-10-26 ENCOUNTER — Other Ambulatory Visit: Payer: Self-pay | Admitting: Neurology

## 2022-10-28 ENCOUNTER — Telehealth: Payer: Self-pay | Admitting: Neurology

## 2022-10-28 NOTE — Telephone Encounter (Signed)
The patient's EEG has not been resulted yet. I will call back when the results are read.

## 2022-10-28 NOTE — Telephone Encounter (Signed)
Called pt back and she said she said she is following up on brain scan.

## 2022-10-28 NOTE — Telephone Encounter (Signed)
Pt called to follow-up on MRI results. Pt is requesting a call back from nurse.

## 2022-11-02 ENCOUNTER — Other Ambulatory Visit: Payer: Self-pay

## 2022-11-02 DIAGNOSIS — E039 Hypothyroidism, unspecified: Secondary | ICD-10-CM

## 2022-11-02 MED ORDER — LEVOTHYROXINE SODIUM 75 MCG PO TABS: 75.0000 ug | ORAL_TABLET | Freq: Every day | ORAL | 0 refills | Status: DC

## 2022-11-03 NOTE — Telephone Encounter (Signed)
Pt inquiring about EEG results. She was confused and thought she had had an brain scan.

## 2022-11-08 ENCOUNTER — Telehealth: Payer: Self-pay | Admitting: Neurology

## 2022-11-08 NOTE — Telephone Encounter (Signed)
Pt is asking to be called with results to her EEG

## 2022-11-08 NOTE — Telephone Encounter (Signed)
Pt called and stated she would like to talk to nurse about EEG results.

## 2022-11-08 NOTE — Telephone Encounter (Signed)
Pt called again and left a VM message requesting a call back to go over EEG results.

## 2022-11-10 NOTE — Telephone Encounter (Signed)
Had called patient for normal EEG  She is only taking Keppra 500 mg every morning, last spell was in December 2020 when she lost her daughter,  She reported recurrent spells of staring, confusion, difficulty talking in the past, this happened in the setting of extreme stress, losing her son and daughter  She is overall doing well, advised her to stop taking Keppra, call clinic for any recurrent spells.

## 2022-11-10 NOTE — Procedures (Signed)
   HISTORY: 82 year old female with history of recurrent staring spells, is taking Keppra  TECHNIQUE:  This is a routine 16 channel EEG recording with one channel devoted to a limited EKG recording.  It was performed during wakefulness, drowsiness and asleep.  Photic stimulation were performed as activating procedures.  There are minimum muscle and movement artifact noted.  Upon maximum arousal, posterior dominant waking rhythm consistent of rhythmic alpha range activity. Activities are symmetric over the bilateral posterior derivations and attenuated with eye opening.  Hyperventilation was not performed Photic stimulation did not alter the tracing.  During EEG recording, patient developed drowsiness and no deeper stage of sleep was achieved  During EEG recording, there was no epileptiform discharge noted.  EKG demonstrate normal sinus rhythm.  CONCLUSION: This is a  normal awake EEG.  There is no electrodiagnostic evidence of epileptiform discharge.  Marcial Pacas, M.D. Ph.D.  [facility] [place], [place] [address] Phone: [phone] Fax:      [phone]

## 2022-11-15 NOTE — Telephone Encounter (Signed)
I left a VM detailing normal results (as per DPR).

## 2022-12-08 NOTE — Progress Notes (Signed)
Office Visit Note  Patient: Robin Horton             Date of Birth: 12/25/1940           MRN: [id number]             PCP: Rema Fendt, NP Referring: Rema Fendt, NP Visit Date: 12/22/2022 Occupation: @GUAROCC @  Subjective:  Discuss treatment for osteoporosis  History of Present Illness: Robin Horton is a 82 y.o. female history of osteoporosis and osteoarthritis.  According the patient she was diagnosed with osteoporosis when she moved to [facility] in 2019 by Dr. Uvaldo Rising.  She was placed on Fosamax.  She has been taking Fosamax 70 mg p.o. weekly since June 2019 without interruption.  Her bone density has improved gradually.  She states she takes vitamin D 1000 units daily.  Does not take any calcium.  She also has osteoarthritis in her knee joints.  She had left total knee replacement several years ago.  And the right total knee replacement in March 2019.  She has any stiffness or pain in her knee joints.  She states she has difficulty bending her knees due to replaced knees.  None of the other joints are painful.  She came to discuss future treatment plan for osteoporosis.  She states she had a tibia fracture many years ago after a fall.  She never had a stress fracture.  No history of femur fracture in her parents.  Has been taking Synthroid since she 2019.  She has not taken any medications for reflux.  She has been on Keppra in the past for her seizure episodes.  Patient states she took it from 2019 until 2021 and has been off Keppra.  She takes gabapentin for restless leg syndrome.  Gravida 4, para 4.  There is no history of miscarriages.    Activities of Daily Living:  Patient reports morning stiffness for 0 minutes.   Patient Denies nocturnal pain.  Difficulty dressing/grooming: Denies Difficulty climbing stairs: Denies Difficulty getting out of chair: Denies Difficulty using hands for taps, buttons, cutlery, and/or writing: Denies  Review of Systems  Constitutional:   Negative for fatigue.  HENT:  Negative for mouth sores and mouth dryness.   Eyes:  Negative for dryness.  Respiratory:  Negative for shortness of breath.   Cardiovascular:  Negative for chest pain and palpitations.  Gastrointestinal:  Negative for blood in stool, constipation and diarrhea.  Endocrine: Negative for increased urination.  Genitourinary:  Negative for involuntary urination.  Musculoskeletal:  Negative for joint pain, gait problem, joint pain, joint swelling, myalgias, muscle weakness, morning stiffness, muscle tenderness and myalgias.  Skin:  Negative for color change, rash, hair loss and sensitivity to sunlight.  Allergic/Immunologic: Negative for susceptible to infections.  Neurological:  Negative for dizziness and headaches.  Hematological:  Negative for swollen glands.  Psychiatric/Behavioral:  Negative for depressed mood and sleep disturbance. The patient is not nervous/anxious.     PMFS History:  Patient Active Problem List   Diagnosis Date Noted   Other specified disorders of bone density and structure, left thigh 03/12/2022   Gastroesophageal reflux disease 03/03/2022   Hiatal hernia 03/03/2022   Screening for colorectal cancer 03/03/2022   Gait abnormality 01/01/2020   Memory loss 01/01/2020   Partial symptomatic epilepsy with complex partial seizures, not intractable, without status epilepticus 02/22/2018   Obese 11/30/2017   S/P right TKA 11/28/2017   RLS (restless legs syndrome) 08/25/2016   Benign essential hypertension  10/20/2015   Dyslipidemia 10/20/2015   Heartburn 10/20/2015   Hypothyroidism 10/20/2015   Memory loss, short term 10/20/2015   Palpitations 10/20/2015   Primary osteoarthritis of left knee 10/20/2015   Vitamin D deficiency 10/20/2015    Past Medical History:  Diagnosis Date   Anxiety    Chronic kidney disease    Dyslipidemia    GERD (gastroesophageal reflux disease)    History of bronchitis    History of hiatal hernia    History  of palpitations    History of short term memory loss    Hypertension    Hypothyroidism    Iron deficiency anemia    OA (osteoarthritis) of knee    Left   Restless legs syndrome (RLS)    Seizures    Myoclonic epilepsy   Urinary incontinence    Vitamin D deficiency     Family History  Problem Relation Age of Onset   Colon cancer Mother    Stroke Mother        died at age 60   Diabetes Father        died at age 76   Ovarian cancer Daughter    Stroke Son    Heart attack Son    Healthy Son    Esophageal cancer Neg Hx    Rectal cancer Neg Hx    Stomach cancer Neg Hx    Past Surgical History:  Procedure Laterality Date   ABDOMINAL HYSTERECTOMY     BREAST BIOPSY     CATARACT EXTRACTION, BILATERAL Bilateral 2022   COLONOSCOPY     KNEE SURGERY     TONSILLECTOMY     TOTAL KNEE ARTHROPLASTY Right 11/28/2017   Procedure: RIGHT TOTAL KNEE ARTHROPLASTY;  Surgeon: Durene Romans, MD;  Location: [facility];  Service: Orthopedics;  Laterality: Right;  Adductor Block   TOTAL KNEE ARTHROPLASTY Left    Social History   Social History Narrative   Lives alone.   Right-handed.   2 cups caffeine per day.   Immunization History  Administered Date(s) Administered   Fluad Quad(high Dose 65+) 05/07/2022   Influenza Split 07/08/2015, 07/18/2019   Influenza, High Dose Seasonal PF 05/03/2017, 06/06/2017, 07/02/2018, 05/18/2021   PFIZER(Purple Top)SARS-COV-2 Vaccination 10/30/2019, 11/20/2019, 07/28/2020   Pneumococcal Conjugate-13 05/12/2016, 09/16/2017   Pneumococcal Polysaccharide-23 07/20/2013   Zoster, Live 07/20/2013     Objective: Vital Signs: BP 133/80 (BP Location: Right Arm, Patient Position: Sitting, Cuff Size: Large)   Pulse 65   Resp 16   Ht 5' 3.5" (1.613 m)   Wt 197 lb 9.6 oz (89.6 kg)   BMI 34.45 kg/m    Physical Exam Vitals and nursing note reviewed.  Constitutional:      Appearance: She is well-developed.  HENT:     Head: Normocephalic and atraumatic.  Eyes:      Conjunctiva/sclera: Conjunctivae normal.  Cardiovascular:     Rate and Rhythm: Normal rate and regular rhythm.     Heart sounds: Normal heart sounds.  Pulmonary:     Effort: Pulmonary effort is normal.     Breath sounds: Normal breath sounds.  Abdominal:     General: Bowel sounds are normal.     Palpations: Abdomen is soft.  Musculoskeletal:     Cervical back: Normal range of motion.  Lymphadenopathy:     Cervical: No cervical adenopathy.  Skin:    General: Skin is warm and dry.     Capillary Refill: Capillary refill takes less than 2 seconds.  Neurological:  Mental Status: She is alert and oriented to person, place, and time.  Psychiatric:        Behavior: Behavior normal.      Musculoskeletal Exam:: Spine was in good range of motion.  She had thoracic kyphosis.  Lumbar spine range of motion was difficult to assess.  Shoulder joints, elbow joints, wrist joints with good range of motion.  She had bilateral PIP and DIP thickening with no synovitis.  Hip joints were in good range of motion.  Bilateral knee joints were replaced and they were in good range of motion without any warmth swelling or effusion.  There was no tenderness over ankles or MTPs.  CDAI Exam: CDAI Score: -- Patient Global: --; Provider Global: -- Swollen: --; Tender: -- Joint Exam 12/22/2022   No joint exam has been documented for this visit   There is currently no information documented on the homunculus. Go to the Rheumatology activity and complete the homunculus joint exam.  Investigation: No additional findings.  Imaging: No results found.  Recent Labs: Lab Results  Component Value Date   WBC 7.1 03/12/2022   HGB 13.7 03/12/2022   PLT 221 03/12/2022   NA 142 03/12/2022   K 4.4 03/12/2022   CL 102 03/12/2022   CO2 21 03/12/2022   GLUCOSE 88 03/12/2022   BUN 21 03/12/2022   CREATININE 1.43 (H) 03/12/2022   BILITOT 0.5 03/12/2022   ALKPHOS 98 03/12/2022   AST 29 03/12/2022   ALT 18  03/12/2022   PROT 6.7 03/12/2022   ALBUMIN 4.3 03/12/2022   CALCIUM 9.7 03/12/2022   GFRAA 61 01/01/2020    June 21, 2022 DEXA scan:The BMD measured at Forearm Radius 33% is 0.704 g/cm2 with a T-score of -2.0. AP Spine L1-L4 (L3) 06/21/2022 81.0 -0.6 1.098 g/cm2 * AP Spine L1-L4 (L3) 05/05/2020 78.9 -1.7 0.965 g/cm2  05/05/20 The BMD measured at AP Spine L1-L2 is 0.823 g/cm2 with a T-score of -2.8.  11/14/17 The BMD measured at AP Spine L1-L4 is 0.953 g/cm2 with a T-score of -1.9.   August 06, 2022 CBC normal, CMP showed creatinine 1.24, GFR 44, UA normal, PTH 71  Speciality Comments: Fosamax 02/2018  Procedures:  No procedures performed Allergies: Lamictal [lamotrigine], Hydrochlorothiazide, and Sulfamethoxazole   Assessment / Plan:     Visit Diagnoses: Age-related osteoporosis without current pathological fracture- June 21, 2022 the BMD measured at Forearm Radius 33% is 0.704 g/cm2 with a T-scoreof -2.0.  I have reviewed her last 3 DEXA scans and had a detailed discussion with the patient.  And March 2019 the lowest T-score was -1.9 which came in the category of osteopenia and in 2021 the T-score was -2.8 which falls in the category of osteoporosis.  Although on the most recent report the AP spine T-score is reported as -1.7.  There is a discrepancy in the reporting of the T-score.  I am uncertain if patient was ever in the osteoporosis range.  Her most recent DEXA scan showed a T-score of -2.0.  Ideally the BMD is should be compared and there should be percentage changes reported which is not reported on the DEXA scan.  Patient states that she has been on Fosamax 70 mg p.o. weekly since June 2019 without any interruption.  I had a detailed discussion with the patient regarding increased risk of atypical fracture with prolonged use of Fosamax.  Since she has been on Fosamax for almost 5 years I advised her to discontinue Fosamax.  She should have repeat  bone density in 2 years and  based on that she can have future treatment.  I discussed the necessity of having a drug holiday for the prevention of atypical femur fracture.  Patient was in agreement.  She will discontinue alendronate.  She was advised to continue with calcium rich diet and vitamin D and daily exercise.  She has been also experiencing reflux symptoms since she has been on alendronate.  Hopefully stopping alendronate will improve her reflux symptoms.  In the future if she needs bisphosphonates and her GFR is a stable then IV Reclast would be a good option.  Good dental hygiene and regular visits with dentist were also emphasized.  According to the patient coming to [facility] is not convenient for her.  Advised her to come for a follow-up visit after her next DEXA scan.  Primary osteoarthritis of both hands-she denies any discomfort in her hands.  Bilateral PIP and DIP thickening was noted.  Joint protection muscle strengthening was discussed.  I advised her to contact me if she develops any increased joint pain or other symptoms.  Status post total bilateral knee replacement-she does not recall when she had left total knee replacement.  Right total knee replacement was in March 2019.  She had good range of motion without any warmth swelling or effusion.  She denies any discomfort in her knee joints.  She ambulates with the help of a cane.  Stage 3b chronic kidney disease-GFR was 44 and December 2023.  She states she follows at [place] and gets regular labs there.  History of gastroesophageal reflux (GERD)-she is taking omeprazole and Tums due to reflux.  Hiatal hernia  Benign essential hypertension-her blood pressure was 133/80 today.  She is on Norvasc 5 mg p.o. daily.  Dyslipidemia-she has been on Lipitor 40 mg p.o. daily.  Vitamin D deficiency-she takes 1000 units of vitamin D daily.  Acquired hypothyroidism-treated with levothyroxine.  Memory loss-patient is on Namenda.  RLS (restless  legs syndrome)-she takes gabapentin.  Partial symptomatic epilepsy with complex partial seizures, not intractable, without status epilepticus - no episode the last 2 years.  She was treated with Keppra.  Patient states that Keppra was a started in 2019 although she is not certain.  Orders: No orders of the defined types were placed in this encounter.  No orders of the defined types were placed in this encounter.   Face-to-face time spent with patient was 45 minutes. Greater than 50% of time was spent in counseling and coordination of care.  Follow-Up Instructions: Return in about 2 years (around 12/21/2024) for Osteoporosis, Osteoarthritis.   Pollyann Savoy, MD  Note - This record has been created using Animal nutritionist.  Chart creation errors have been sought, but may not always  have been located. Such creation errors do not reflect on  the standard of medical care.

## 2022-12-20 ENCOUNTER — Other Ambulatory Visit: Payer: Self-pay | Admitting: Family

## 2022-12-21 NOTE — Telephone Encounter (Signed)
Called Pt to make appt for OV. Pt will call back to schedule.

## 2022-12-21 NOTE — Telephone Encounter (Signed)
Courtesy refill. Patient will need an office visit for further refills. Requested Prescriptions  Pending Prescriptions Disp Refills   amLODipine (NORVASC) 5 MG tablet [Pharmacy Med Name: AMLODIPINE BESYLATE 5 MG Tablet] 30 tablet 0    Sig: TAKE 1 TABLET EVERY DAY     Cardiovascular: Calcium Channel Blockers 2 Failed - 12/20/2022  1:44 AM      Failed - Last BP in normal range    BP Readings from Last 1 Encounters:  08/17/22 (!) 153/87         Failed - Valid encounter within last 6 months    Recent Outpatient Visits           6 months ago Osteoporosis, unspecified osteoporosis type, unspecified pathological fracture presence   [facility], [place], NP   12 months ago Encounter to establish care   [facility], [place], NP              Passed - Last Heart Rate in normal range    Pulse Readings from Last 1 Encounters:  08/17/22 74

## 2022-12-22 ENCOUNTER — Encounter: Payer: Self-pay | Admitting: Rheumatology

## 2022-12-22 ENCOUNTER — Ambulatory Visit: Payer: Medicare HMO | Attending: Rheumatology | Admitting: Rheumatology

## 2022-12-22 VITALS — BP 133/80 | HR 65 | Resp 16 | Ht 63.5 in | Wt 197.6 lb

## 2022-12-22 DIAGNOSIS — M19042 Primary osteoarthritis, left hand: Secondary | ICD-10-CM

## 2022-12-22 DIAGNOSIS — I10 Essential (primary) hypertension: Secondary | ICD-10-CM | POA: Diagnosis not present

## 2022-12-22 DIAGNOSIS — M8589 Other specified disorders of bone density and structure, multiple sites: Secondary | ICD-10-CM

## 2022-12-22 DIAGNOSIS — M810 Age-related osteoporosis without current pathological fracture: Secondary | ICD-10-CM

## 2022-12-22 DIAGNOSIS — M1712 Unilateral primary osteoarthritis, left knee: Secondary | ICD-10-CM

## 2022-12-22 DIAGNOSIS — M19041 Primary osteoarthritis, right hand: Secondary | ICD-10-CM | POA: Diagnosis not present

## 2022-12-22 DIAGNOSIS — E785 Hyperlipidemia, unspecified: Secondary | ICD-10-CM | POA: Diagnosis not present

## 2022-12-22 DIAGNOSIS — Z96653 Presence of artificial knee joint, bilateral: Secondary | ICD-10-CM

## 2022-12-22 DIAGNOSIS — E039 Hypothyroidism, unspecified: Secondary | ICD-10-CM

## 2022-12-22 DIAGNOSIS — E559 Vitamin D deficiency, unspecified: Secondary | ICD-10-CM

## 2022-12-22 DIAGNOSIS — Z8719 Personal history of other diseases of the digestive system: Secondary | ICD-10-CM

## 2022-12-22 DIAGNOSIS — G40209 Localization-related (focal) (partial) symptomatic epilepsy and epileptic syndromes with complex partial seizures, not intractable, without status epilepticus: Secondary | ICD-10-CM

## 2022-12-22 DIAGNOSIS — N1832 Chronic kidney disease, stage 3b: Secondary | ICD-10-CM | POA: Diagnosis not present

## 2022-12-22 DIAGNOSIS — Z96651 Presence of right artificial knee joint: Secondary | ICD-10-CM

## 2022-12-22 DIAGNOSIS — G2581 Restless legs syndrome: Secondary | ICD-10-CM

## 2022-12-22 DIAGNOSIS — R413 Other amnesia: Secondary | ICD-10-CM

## 2022-12-22 DIAGNOSIS — K449 Diaphragmatic hernia without obstruction or gangrene: Secondary | ICD-10-CM

## 2022-12-22 NOTE — Patient Instructions (Addendum)
Please discontinue alendronate.  You should get repeat DEXA scan in October 2025 to make decision regarding future treatments.  Please continue consuming calcium rich diet and over-the-counter vitamin D.  Osteoporosis  Osteoporosis is when the bones get thin and weak. This can cause your bones to break (fracture) more easily. What are the causes? The exact cause of this condition is not known. What increases the risk? Having family members with this condition. Not eating enough healthy foods. Taking certain medicines. Being female. Being age 5 or older. Smoking or using other products that contain nicotine or tobacco, such as e-cigarettes or chewing tobacco. Not exercising. Being of European or Asian ancestry. Having a small body frame. What are the signs or symptoms? A broken bone might be the first sign, especially if the break results from a fall or injury that usually would not cause a bone to break. Other signs and symptoms include: Pain in the neck or low back. Being hunched over (stooped posture). Getting shorter. How is this treated? Eating more foods with more calcium and vitamin D in them. Doing exercises. Stopping tobacco use. Limiting how much alcohol you drink. Taking medicines to slow bone loss or help make the bones stronger. Taking supplements of calcium and vitamin D every day. Taking medicines to replace chemicals in the body (hormone replacement medicines). Monitoring your levels of calcium and vitamin D. The goal of treatment is to strengthen your bones and lower your risk for a bone break. Follow these instructions at home: Eating and drinking Eat plenty of calcium and vitamin D. These nutrients are good for your bones. Good sources of calcium and vitamin D include: Some fish, such as salmon and tuna. Foods that have calcium and vitamin D added to them (fortified foods), such as some breakfast cereals. Egg yolks. Cheese. Liver.  Activity Do exercises  as told by your doctor. Ask your doctor what exercises are safe for you. You should do: Exercises that make your muscles work to hold your body weight up (weight-bearing exercises). These include tai chi, yoga, and walking. Exercises to make your muscles stronger. One example is lifting weights. Lifestyle Do not drink alcohol if: Your doctor tells you not to drink. You are pregnant, may be pregnant, or are planning to become pregnant. If you drink alcohol: Limit how much you use to: 0-1 drink a day for women. 0-2 drinks a day for men. Know how much alcohol is in your drink. In the [place] one drink equals one 12 oz bottle of beer (355 mL), one 5 oz glass of wine (148 mL), or one 1 oz glass of hard liquor (44 mL). Do not smoke or use any products that contain nicotine or tobacco. If you need help quitting, ask your doctor. Preventing falls Use tools to help you move around (mobility aids) as needed. These include canes, walkers, scooters, and crutches. Keep rooms well-lit. Put away things on the floor that could make you trip. These include cords and rugs. Install safety rails on stairs. Install grab bars in bathrooms. Use rubber mats in slippery areas, like bathrooms. Wear shoes that: Fit you well. Support your feet. Have closed toes. Have rubber soles or low heels. Tell your doctor about all of the medicines you are taking. Some medicines can make you more likely to fall. General instructions Take over-the-counter and prescription medicines only as told by your doctor. Keep all follow-up visits. Contact a doctor if: You have not been tested (screened) for osteoporosis and you are:  A woman who is age 56 or older. A man who is age 1 or older. Get help right away if: You fall. You get hurt. Summary Osteoporosis happens when your bones get thin and weak. Weak bones can break (fracture) more easily. Eat plenty of calcium and vitamin D. These are good for your bones. Tell your  doctor about all of the medicines that you take. This information is not intended to replace advice given to you by your health care provider. Make sure you discuss any questions you have with your health care provider. Document Revised: 02/07/2020 Document Reviewed: 02/07/2020 Elsevier Patient Education  2023 [employer].

## 2023-01-07 ENCOUNTER — Ambulatory Visit: Payer: Medicare HMO | Admitting: Rheumatology

## 2023-01-11 ENCOUNTER — Other Ambulatory Visit: Payer: Self-pay | Admitting: Family

## 2023-02-02 ENCOUNTER — Other Ambulatory Visit: Payer: Self-pay | Admitting: Family

## 2023-02-02 DIAGNOSIS — D631 Anemia in chronic kidney disease: Secondary | ICD-10-CM | POA: Diagnosis not present

## 2023-02-02 DIAGNOSIS — E039 Hypothyroidism, unspecified: Secondary | ICD-10-CM

## 2023-02-02 DIAGNOSIS — N1832 Chronic kidney disease, stage 3b: Secondary | ICD-10-CM | POA: Diagnosis not present

## 2023-02-02 DIAGNOSIS — N2581 Secondary hyperparathyroidism of renal origin: Secondary | ICD-10-CM | POA: Diagnosis not present

## 2023-02-02 DIAGNOSIS — I129 Hypertensive chronic kidney disease with stage 1 through stage 4 chronic kidney disease, or unspecified chronic kidney disease: Secondary | ICD-10-CM | POA: Diagnosis not present

## 2023-02-02 LAB — LAB REPORT - SCANNED: EGFR: 39

## 2023-02-03 ENCOUNTER — Ambulatory Visit (INDEPENDENT_AMBULATORY_CARE_PROVIDER_SITE_OTHER): Payer: Medicare HMO

## 2023-02-03 VITALS — Ht 64.0 in | Wt 193.0 lb

## 2023-02-03 DIAGNOSIS — Z0000 Encounter for general adult medical examination without abnormal findings: Secondary | ICD-10-CM | POA: Diagnosis not present

## 2023-02-03 NOTE — Progress Notes (Signed)
Patient ID: Robin Horton, female    DOB: [birth year]  MRN: [id number]  CC: Follow-Up  Subjective: Robin Horton is a 82 y.o. female who presents for follow-up.   Her concerns today include:  Doing well on Levothyroxine, no issues/concerns. No further issues/concerns for discussion today.  Patient Active Problem List   Diagnosis Date Noted   Other specified disorders of bone density and structure, left thigh 03/12/2022   Gastroesophageal reflux disease 03/03/2022   Hiatal hernia 03/03/2022   Screening for colorectal cancer 03/03/2022   Gait abnormality 01/01/2020   Memory loss 01/01/2020   Partial symptomatic epilepsy with complex partial seizures, not intractable, without status epilepticus (HCC) 02/22/2018   Obese 11/30/2017   S/P right TKA 11/28/2017   RLS (restless legs syndrome) 08/25/2016   Benign essential hypertension 10/20/2015   Dyslipidemia 10/20/2015   Heartburn 10/20/2015   Hypothyroidism 10/20/2015   Memory loss, short term 10/20/2015   Palpitations 10/20/2015   Primary osteoarthritis of left knee 10/20/2015   Vitamin D deficiency 10/20/2015     Current Outpatient Medications on File Prior to Visit  Medication Sig Dispense Refill   amLODipine (NORVASC) 5 MG tablet TAKE 1 TABLET EVERY DAY 30 tablet 0   atorvastatin (LIPITOR) 40 MG tablet TAKE 1 TABLET EVERY DAY 90 tablet 1   Cholecalciferol 25 MCG (1000 UT) tablet Take 1,000 Units by mouth daily.     furosemide (LASIX) 40 MG tablet Take 40 mg by mouth daily.     levothyroxine (SYNTHROID) 75 MCG tablet TAKE 1 TABLET EVERY DAY BEFORE BREAKFAST 90 tablet 0   memantine (NAMENDA) 10 MG tablet Take 1 tablet (10 mg total) by mouth 2 (two) times daily. 180 tablet 3   omeprazole (PRILOSEC) 40 MG capsule Take 1 capsule (40 mg total) by mouth daily. 90 capsule 3   gabapentin (NEURONTIN) 100 MG capsule Take one cap in am and two caps QHS. (Patient not taking: Reported on 02/03/2023) 270 capsule 3   levETIRAcetam (KEPPRA)  500 MG tablet Take 1 tablet (500 mg total) by mouth 2 (two) times daily. (Patient not taking: Reported on 12/22/2022) 60 tablet 11   No current facility-administered medications on file prior to visit.    Allergies  Allergen Reactions   Lamictal [Lamotrigine] Rash   Hydrochlorothiazide Other (See Comments)    Other reaction(s): Other Renal insufficiency Renal insufficiency    Sulfamethoxazole Rash    Social History   Socioeconomic History   Marital status: Widowed    Spouse name: Not on file   Number of children: 4   Years of education: 12   Highest education level: High school graduate  Occupational History   Occupation: Retired  Tobacco Use   Smoking status: Former    Passive exposure: Past   Smokeless tobacco: Never  Building services engineer Use: Never used  Substance and Sexual Activity   Alcohol use: No   Drug use: No   Sexual activity: Not on file  Other Topics Concern   Not on file  Social History Narrative   Lives alone.   Right-handed.   2 cups caffeine per day.   Social Determinants of Health   Financial Resource Strain: Low Risk  (02/03/2023)   Overall Financial Resource Strain (CARDIA)    Difficulty of Paying Living Expenses: Not hard at all  Food Insecurity: No Food Insecurity (02/03/2023)   Hunger Vital Sign    Worried About Running Out of Food in the Last Year: Never true  Ran Out of Food in the Last Year: Never true  Transportation Needs: No Transportation Needs (02/03/2023)   PRAPARE - Administrator, Civil Service (Medical): No    Lack of Transportation (Non-Medical): No  Physical Activity: Inactive (02/03/2023)   Exercise Vital Sign    Days of Exercise per Week: 0 days    Minutes of Exercise per Session: 0 min  Stress: No Stress Concern Present (02/03/2023)   [employer] of Occupational Health - Occupational Stress Questionnaire    Feeling of Stress : Not at all  Social Connections: Not on file  Intimate Partner Violence: Not  on file    Family History  Problem Relation Age of Onset   Colon cancer Mother    Stroke Mother        died at age 71   Diabetes Father        died at age 40   Ovarian cancer Daughter    Stroke Son    Heart attack Son    Healthy Son    Esophageal cancer Neg Hx    Rectal cancer Neg Hx    Stomach cancer Neg Hx     Past Surgical History:  Procedure Laterality Date   ABDOMINAL HYSTERECTOMY     BREAST BIOPSY     CATARACT EXTRACTION, BILATERAL Bilateral 2022   COLONOSCOPY     KNEE SURGERY     TONSILLECTOMY     TOTAL KNEE ARTHROPLASTY Right 11/28/2017   Procedure: RIGHT TOTAL KNEE ARTHROPLASTY;  Surgeon: Durene Romans, MD;  Location: [facility];  Service: Orthopedics;  Laterality: Right;  Adductor Block   TOTAL KNEE ARTHROPLASTY Left     ROS: Review of Systems Negative except as stated above  PHYSICAL EXAM: BP 120/78   Pulse 82   Temp 98.1 F (36.7 C) (Oral)   Resp 16   Wt 189 lb 7.2 oz (85.9 kg)   SpO2 94%   BMI 32.52 kg/m   Physical Exam HENT:     Head: Normocephalic and atraumatic.     Nose: Nose normal.     Mouth/Throat:     Mouth: Mucous membranes are moist.     Pharynx: Oropharynx is clear.  Eyes:     Extraocular Movements: Extraocular movements intact.     Conjunctiva/sclera: Conjunctivae normal.     Pupils: Pupils are equal, round, and reactive to light.  Cardiovascular:     Rate and Rhythm: Normal rate and regular rhythm.     Pulses: Normal pulses.     Heart sounds: Normal heart sounds.  Pulmonary:     Effort: Pulmonary effort is normal.     Breath sounds: Normal breath sounds.  Musculoskeletal:     Cervical back: Normal range of motion and neck supple.  Neurological:     General: No focal deficit present.     Mental Status: She is alert and oriented to person, place, and time.  Psychiatric:        Mood and Affect: Mood normal.        Behavior: Behavior normal.     ASSESSMENT AND PLAN: 1. Hypothyroidism, unspecified type - Continue  Levothyroxine as prescribed. No refills needed as of present.  - Routine screening.  - Follow-up with primary provider as scheduled. - TSH   Patient was given the opportunity to ask questions.  Patient verbalized understanding of the plan and was able to repeat key elements of the plan. Patient was given clear instructions to go to Emergency Department or return  to medical center if symptoms don't improve, worsen, or new problems develop.The patient verbalized understanding.   Orders Placed This Encounter  Procedures   TSH    Return in about 3 months (around 05/10/2023) for Follow-Up or next available chronic care mgmt .  Rema Fendt, NP

## 2023-02-03 NOTE — Telephone Encounter (Signed)
Requested Prescriptions  Pending Prescriptions Disp Refills   levothyroxine (SYNTHROID) 75 MCG tablet [Pharmacy Med Name: LEVOTHYROXINE SODIUM 75 MCG Tablet] 90 tablet 0    Sig: TAKE 1 TABLET EVERY DAY BEFORE BREAKFAST     Endocrinology:  Hypothyroid Agents Passed - 02/02/2023  5:36 PM      Passed - TSH in normal range and within 360 days    TSH  Date Value Ref Range Status  03/12/2022 2.660 0.450 - 4.500 uIU/mL Final         Passed - Valid encounter within last 12 months    Recent Outpatient Visits           7 months ago Osteoporosis, unspecified osteoporosis type, unspecified pathological fracture presence   [facility], [place], NP   1 year ago Encounter to establish care   [facility], Salomon Fick, NP       Future Appointments             In 4 days Rema Fendt, NP [facility]   In 1 year Pollyann Savoy, MD [facility]

## 2023-02-03 NOTE — Progress Notes (Addendum)
I connected with  Vergia Alcon on 02/03/23 by a audio enabled telemedicine application and verified that I am speaking with the correct person using two identifiers.  Patient Location: Home  Provider Location: Office/Clinic  I discussed the limitations of evaluation and management by telemedicine. The patient expressed understanding and agreed to proceed.  Subjective:   Robin Horton is a 82 y.o. female who presents for Medicare Annual (Subsequent) preventive examination.  Review of Systems     Cardiac Risk Factors include: advanced age (>66men, >72 women);dyslipidemia;hypertension;obesity (BMI >30kg/m2)     Objective:    Today's Vitals   02/03/23 1156  Weight: 193 lb (87.5 kg)  Height: 5\' 4"  (1.626 m)   Body mass index is 33.13 kg/m.     02/03/2023   12:03 PM 03/12/2022    1:39 PM 11/28/2017    2:50 PM 11/21/2017    8:57 AM  Advanced Directives  Does Patient Have a Medical Advance Directive? Yes Yes Yes Yes  Type of Estate agent of [place];Living will Living will;Healthcare Power of [employer] Power of [place];Living will Healthcare Power of [place];Living will  Does patient want to make changes to medical advance directive?   No - Patient declined No - Patient declined  Copy of Healthcare Power of Attorney in Chart? Yes - validated most recent copy scanned in chart (See row information) No - copy requested Yes No - copy requested    Current Medications (verified) Outpatient Encounter Medications as of 02/03/2023  Medication Sig   amLODipine (NORVASC) 5 MG tablet TAKE 1 TABLET EVERY DAY   atorvastatin (LIPITOR) 40 MG tablet TAKE 1 TABLET EVERY DAY   Cholecalciferol 25 MCG (1000 UT) tablet Take 1,000 Units by mouth daily.   furosemide (LASIX) 40 MG tablet Take 40 mg by mouth daily.   levothyroxine (SYNTHROID) 75 MCG tablet TAKE 1 TABLET EVERY DAY BEFORE BREAKFAST   memantine (NAMENDA) 10 MG tablet Take 1 tablet (10 mg total) by mouth 2  (two) times daily.   omeprazole (PRILOSEC) 40 MG capsule Take 1 capsule (40 mg total) by mouth daily.   gabapentin (NEURONTIN) 100 MG capsule Take one cap in am and two caps QHS. (Patient not taking: Reported on 02/03/2023)   levETIRAcetam (KEPPRA) 500 MG tablet Take 1 tablet (500 mg total) by mouth 2 (two) times daily. (Patient not taking: Reported on 12/22/2022)   No facility-administered encounter medications on file as of 02/03/2023.    Allergies (verified) Lamictal [lamotrigine], Hydrochlorothiazide, and Sulfamethoxazole   History: Past Medical History:  Diagnosis Date   Anxiety    Chronic kidney disease    Dyslipidemia    GERD (gastroesophageal reflux disease)    History of bronchitis    History of hiatal hernia    History of palpitations    History of short term memory loss    Hypertension    Hypothyroidism    Iron deficiency anemia    OA (osteoarthritis) of knee    Left   Restless legs syndrome (RLS)    Seizures (HCC)    Myoclonic epilepsy   Urinary incontinence    Vitamin D deficiency    Past Surgical History:  Procedure Laterality Date   ABDOMINAL HYSTERECTOMY     BREAST BIOPSY     CATARACT EXTRACTION, BILATERAL Bilateral 2022   COLONOSCOPY     KNEE SURGERY     TONSILLECTOMY     TOTAL KNEE ARTHROPLASTY Right 11/28/2017   Procedure: RIGHT TOTAL KNEE ARTHROPLASTY;  Surgeon: Charlann Boxer,  Molli Hazard, MD;  Location: [facility];  Service: Orthopedics;  Laterality: Right;  Adductor Block   TOTAL KNEE ARTHROPLASTY Left    Family History  Problem Relation Age of Onset   Colon cancer Mother    Stroke Mother        died at age 88   Diabetes Father        died at age 13   Ovarian cancer Daughter    Stroke Son    Heart attack Son    Healthy Son    Esophageal cancer Neg Hx    Rectal cancer Neg Hx    Stomach cancer Neg Hx    Social History   Socioeconomic History   Marital status: Widowed    Spouse name: Not on file   Number of children: 4   Years of education: 12    Highest education level: High school graduate  Occupational History   Occupation: Retired  Tobacco Use   Smoking status: Former    Passive exposure: Past   Smokeless tobacco: Never  Building services engineer Use: Never used  Substance and Sexual Activity   Alcohol use: No   Drug use: No   Sexual activity: Not on file  Other Topics Concern   Not on file  Social History Narrative   Lives alone.   Right-handed.   2 cups caffeine per day.   Social Determinants of Health   Financial Resource Strain: Low Risk  (02/03/2023)   Overall Financial Resource Strain (CARDIA)    Difficulty of Paying Living Expenses: Not hard at all  Food Insecurity: No Food Insecurity (02/03/2023)   Hunger Vital Sign    Worried About Running Out of Food in the Last Year: Never true    Ran Out of Food in the Last Year: Never true  Transportation Needs: No Transportation Needs (02/03/2023)   PRAPARE - Administrator, Civil Service (Medical): No    Lack of Transportation (Non-Medical): No  Physical Activity: Inactive (02/03/2023)   Exercise Vital Sign    Days of Exercise per Week: 0 days    Minutes of Exercise per Session: 0 min  Stress: No Stress Concern Present (02/03/2023)   [employer] of Occupational Health - Occupational Stress Questionnaire    Feeling of Stress : Not at all  Social Connections: Not on file    Tobacco Counseling Counseling given: Not Answered   Clinical Intake:  Pre-visit preparation completed: Yes  Pain : No/denies pain     Nutritional Status: BMI > 30  Obese Nutritional Risks: None Diabetes: No  How often do you need to have someone help you when you read instructions, pamphlets, or other written materials from your doctor or pharmacy?: 1 - Never  Diabetic? no  Interpreter Needed?: No  Information entered by :: NAllen LPN   Activities of Daily Living    02/03/2023   12:05 PM 03/12/2022    1:33 PM  In your present state of health, do you have any  difficulty performing the following activities:  Hearing? 0 0  Vision? 0 0  Difficulty concentrating or making decisions? 0 0  Walking or climbing stairs? 0 0  Dressing or bathing? 0 0  Doing errands, shopping? 0 0  Preparing Food and eating ? N   Using the Toilet? N   In the past six months, have you accidently leaked urine? Y   Do you have problems with loss of bowel control? N   Managing your Medications? N  Managing your Finances? N   Housekeeping or managing your Housekeeping? N     Patient Care Team: Rema Fendt, NP as PCP - General (Nurse Practitioner)  Indicate any recent Medical Services you may have received from other than [facility] providers in the past year (date may be approximate).     Assessment:   This is a routine wellness examination for Jen.  Hearing/Vision screen Vision Screening - Comments:: Regular eye exams, [facility]  Dietary issues and exercise activities discussed: Current Exercise Habits: The patient does not participate in regular exercise at present   Goals Addressed             This Visit's Progress    Patient Stated       02/03/2023, wants to start exercising       Depression Screen    02/03/2023   12:05 PM 03/12/2022    1:36 PM 12/25/2021    3:35 PM  PHQ 2/9 Scores  PHQ - 2 Score 0 0 0  PHQ- 9 Score  0     Fall Risk    02/03/2023   12:05 PM 03/12/2022    1:38 PM  Fall Risk   Falls in the past year? 0 0  Number falls in past yr: 0 0  Injury with Fall? 0   Risk for fall due to : Medication side effect No Fall Risks  Follow up Falls prevention discussed;Education provided;Falls evaluation completed     FALL RISK PREVENTION PERTAINING TO THE HOME:  Any stairs in or around the home? No  If so, are there any without handrails?  N/a Home free of loose throw rugs in walkways, pet beds, electrical cords, etc? Yes  Adequate lighting in your home to reduce risk of falls? Yes   ASSISTIVE DEVICES UTILIZED TO PREVENT  FALLS:  Life alert? No  Use of a cane, walker or w/c? Yes  Grab bars in the bathroom? Yes  Shower chair or bench in shower? Yes  Elevated toilet seat or a handicapped toilet? Yes   TIMED UP AND GO:  Was the test performed? No .      Cognitive Function:    03/12/2022    1:34 PM 07/03/2020    8:22 AM 01/01/2020    8:47 AM  MMSE - Mini Mental State Exam  Orientation to time 5 4 5   Orientation to Place 5 5 5   Registration 3 3 3   Attention/ Calculation 5 5 2   Attention/Calculation-comments  Refused to count backwards, wanted to spell backwards instead   Recall 3 1 3   Language- name 2 objects 2 2 2   Language- repeat 1 0 0  Language- follow 3 step command 3 3 3   Language- read & follow direction 1 1 1   Write a sentence 1 1 1   Copy design 1 1 1   Total score 30 26 26       08/17/2022   11:23 AM 08/13/2021    2:13 PM  Montreal Cognitive Assessment   Visuospatial/ Executive (0/5) 3 2  Naming (0/3) 3 3  Attention: Read list of digits (0/2) 2 2  Attention: Read list of letters (0/1) 1 1  Attention: Serial 7 subtraction starting at 100 (0/3) 3 3  Language: Repeat phrase (0/2) 2 2  Language : Fluency (0/1) 0 0  Abstraction (0/2) 1 2  Delayed Recall (0/5) 0 0  Orientation (0/6) 6 6  Total 21 21  Adjusted Score (based on education) 22 22  02/03/2023   12:07 PM  6CIT Screen  What Year? 0 points  What month? 0 points  What time? 0 points  Count back from 20 0 points  Months in reverse 2 points  Repeat phrase 2 points  Total Score 4 points    Immunizations Immunization History  Administered Date(s) Administered   Fluad Quad(high Dose 65+) 05/07/2022   Influenza Split 07/08/2015, 07/18/2019   Influenza, High Dose Seasonal PF 05/03/2017, 06/06/2017, 07/02/2018, 05/18/2021   PFIZER(Purple Top)SARS-COV-2 Vaccination 10/30/2019, 11/20/2019, 07/28/2020   Pneumococcal Conjugate-13 05/12/2016, 09/16/2017   Pneumococcal Polysaccharide-23 07/20/2013   Zoster, Live  07/20/2013    TDAP status: Up to date  Flu Vaccine status: Up to date  Pneumococcal vaccine status: Up to date  Covid-19 vaccine status: Completed vaccines  Qualifies for Shingles Vaccine? Yes   Zostavax completed Yes   Shingrix Completed?: No.    Education has been provided regarding the importance of this vaccine. Patient has been advised to call insurance company to determine out of pocket expense if they have not yet received this vaccine. Advised may also receive vaccine at local pharmacy or Health Dept. Verbalized acceptance and understanding.  Screening Tests Health Maintenance  Topic Date Due   DTaP/Tdap/Td (1 - Tdap) Never done   Zoster Vaccines- Shingrix (1 of 2) Never done   COVID-19 Vaccine (4 - 2023-24 season) 05/07/2022   Medicare Annual Wellness (AWV)  03/13/2023   INFLUENZA VACCINE  04/07/2023   Pneumonia Vaccine 55+ Years old  Completed   DEXA SCAN  Completed   HPV VACCINES  Aged Out    Health Maintenance  Health Maintenance Due  Topic Date Due   DTaP/Tdap/Td (1 - Tdap) Never done   Zoster Vaccines- Shingrix (1 of 2) Never done   COVID-19 Vaccine (4 - 2023-24 season) 05/07/2022   Medicare Annual Wellness (AWV)  03/13/2023    Colorectal cancer screening: No longer required.   Mammogram status: No longer required due to age.  Bone Density status: Completed 06/21/2022.   Lung Cancer Screening: (Low Dose CT Chest recommended if Age 82-80 years, 30 pack-year currently smoking OR have quit w/in 15years.) does not qualify.   Lung Cancer Screening Referral: no  Additional Screening:  Hepatitis C Screening: does not qualify;   Vision Screening: Recommended annual ophthalmology exams for early detection of glaucoma and other disorders of the eye. Is the patient up to date with their annual eye exam?  Yes  Who is the provider or what is the name of the office in which the patient attends annual eye exams? [facility] If pt is not established with a  provider, would they like to be referred to a provider to establish care? No .   Dental Screening: Recommended annual dental exams for proper oral hygiene  Community Resource Referral / Chronic Care Management: CRR required this visit?  No   CCM required this visit?  No      Plan:     I have personally reviewed and noted the following in the patient's chart:   Medical and social history Use of alcohol, tobacco or illicit drugs  Current medications and supplements including opioid prescriptions. Patient is not currently taking opioid prescriptions. Functional ability and status Nutritional status Physical activity Advanced directives List of other physicians Hospitalizations, surgeries, and ER visits in previous 12 months Vitals Screenings to include cognitive, depression, and falls Referrals and appointments  In addition, I have reviewed and discussed with patient certain preventive protocols, quality metrics, and  best practice recommendations. A written personalized care plan for preventive services as well as general preventive health recommendations were provided to patient.     Barb Merino, LPN   12/13/8117   Nurse Notes: none  Due to this being a virtual visit, the after visit summary with patients personalized plan was offered to patient via mail or my-chart.  to pick up at office at next visit

## 2023-02-03 NOTE — Patient Instructions (Signed)
Robin Horton , Thank you for taking time to come for your Medicare Wellness Visit. I appreciate your ongoing commitment to your health goals. Please review the following plan we discussed and let me know if I can assist you in the future.   These are the goals we discussed:  Goals      Patient Stated     02/03/2023, wants to start exercising        This is a list of the screening recommended for you and due dates:  Health Maintenance  Topic Date Due   DTaP/Tdap/Td vaccine (1 - Tdap) Never done   Zoster (Shingles) Vaccine (1 of 2) Never done   COVID-19 Vaccine (4 - 2023-24 season) 05/07/2022   Flu Shot  04/07/2023   Medicare Annual Wellness Visit  02/03/2024   Pneumonia Vaccine  Completed   DEXA scan (bone density measurement)  Completed   HPV Vaccine  Aged Out    Advanced directives: copy in chart  Conditions/risks identified: none  Next appointment: Follow up in one year for your annual wellness visit    Preventive Care 65 Years and Older, Female Preventive care refers to lifestyle choices and visits with your health care provider that can promote health and wellness. What does preventive care include? A yearly physical exam. This is also called an annual well check. Dental exams once or twice a year. Routine eye exams. Ask your health care provider how often you should have your eyes checked. Personal lifestyle choices, including: Daily care of your teeth and gums. Regular physical activity. Eating a healthy diet. Avoiding tobacco and drug use. Limiting alcohol use. Practicing safe sex. Taking low-dose aspirin every day. Taking vitamin and mineral supplements as recommended by your health care provider. What happens during an annual well check? The services and screenings done by your health care provider during your annual well check will depend on your age, overall health, lifestyle risk factors, and family history of disease. Counseling  Your health care provider may  ask you questions about your: Alcohol use. Tobacco use. Drug use. Emotional well-being. Home and relationship well-being. Sexual activity. Eating habits. History of falls. Memory and ability to understand (cognition). Work and work Astronomer. Reproductive health. Screening  You may have the following tests or measurements: Height, weight, and BMI. Blood pressure. Lipid and cholesterol levels. These may be checked every 5 years, or more frequently if you are over 29 years old. Skin check. Lung cancer screening. You may have this screening every year starting at age 46 if you have a 30-pack-year history of smoking and currently smoke or have quit within the past 15 years. Fecal occult blood test (FOBT) of the stool. You may have this test every year starting at age 17. Flexible sigmoidoscopy or colonoscopy. You may have a sigmoidoscopy every 5 years or a colonoscopy every 10 years starting at age 86. Hepatitis C blood test. Hepatitis B blood test. Sexually transmitted disease (STD) testing. Diabetes screening. This is done by checking your blood sugar (glucose) after you have not eaten for a while (fasting). You may have this done every 1-3 years. Bone density scan. This is done to screen for osteoporosis. You may have this done starting at age 1. Mammogram. This may be done every 1-2 years. Talk to your health care provider about how often you should have regular mammograms. Talk with your health care provider about your test results, treatment options, and if necessary, the need for more tests. Vaccines  Your health  care provider may recommend certain vaccines, such as: Influenza vaccine. This is recommended every year. Tetanus, diphtheria, and acellular pertussis (Tdap, Td) vaccine. You may need a Td booster every 10 years. Zoster vaccine. You may need this after age 58. Pneumococcal 13-valent conjugate (PCV13) vaccine. One dose is recommended after age 22. Pneumococcal  polysaccharide (PPSV23) vaccine. One dose is recommended after age 36. Talk to your health care provider about which screenings and vaccines you need and how often you need them. This information is not intended to replace advice given to you by your health care provider. Make sure you discuss any questions you have with your health care provider. Document Released: 09/19/2015 Document Revised: 05/12/2016 Document Reviewed: 06/24/2015 Elsevier Interactive Patient Education  2017 [employer].  Fall Prevention in the Home Falls can cause injuries. They can happen to people of all ages. There are many things you can do to make your home safe and to help prevent falls. What can I do on the outside of my home? Regularly fix the edges of walkways and driveways and fix any cracks. Remove anything that might make you trip as you walk through a door, such as a raised step or threshold. Trim any bushes or trees on the path to your home. Use bright outdoor lighting. Clear any walking paths of anything that might make someone trip, such as rocks or tools. Regularly check to see if handrails are loose or broken. Make sure that both sides of any steps have handrails. Any raised decks and porches should have guardrails on the edges. Have any leaves, snow, or ice cleared regularly. Use sand or salt on walking paths during winter. Clean up any spills in your garage right away. This includes oil or grease spills. What can I do in the bathroom? Use night lights. Install grab bars by the toilet and in the tub and shower. Do not use towel bars as grab bars. Use non-skid mats or decals in the tub or shower. If you need to sit down in the shower, use a plastic, non-slip stool. Keep the floor dry. Clean up any water that spills on the floor as soon as it happens. Remove soap buildup in the tub or shower regularly. Attach bath mats securely with double-sided non-slip rug tape. Do not have throw rugs and other  things on the floor that can make you trip. What can I do in the bedroom? Use night lights. Make sure that you have a light by your bed that is easy to reach. Do not use any sheets or blankets that are too big for your bed. They should not hang down onto the floor. Have a firm chair that has side arms. You can use this for support while you get dressed. Do not have throw rugs and other things on the floor that can make you trip. What can I do in the kitchen? Clean up any spills right away. Avoid walking on wet floors. Keep items that you use a lot in easy-to-reach places. If you need to reach something above you, use a strong step stool that has a grab bar. Keep electrical cords out of the way. Do not use floor polish or wax that makes floors slippery. If you must use wax, use non-skid floor wax. Do not have throw rugs and other things on the floor that can make you trip. What can I do with my stairs? Do not leave any items on the stairs. Make sure that there are handrails on  both sides of the stairs and use them. Fix handrails that are broken or loose. Make sure that handrails are as long as the stairways. Check any carpeting to make sure that it is firmly attached to the stairs. Fix any carpet that is loose or worn. Avoid having throw rugs at the top or bottom of the stairs. If you do have throw rugs, attach them to the floor with carpet tape. Make sure that you have a light switch at the top of the stairs and the bottom of the stairs. If you do not have them, ask someone to add them for you. What else can I do to help prevent falls? Wear shoes that: Do not have high heels. Have rubber bottoms. Are comfortable and fit you well. Are closed at the toe. Do not wear sandals. If you use a stepladder: Make sure that it is fully opened. Do not climb a closed stepladder. Make sure that both sides of the stepladder are locked into place. Ask someone to hold it for you, if possible. Clearly  mark and make sure that you can see: Any grab bars or handrails. First and last steps. Where the edge of each step is. Use tools that help you move around (mobility aids) if they are needed. These include: Canes. Walkers. Scooters. Crutches. Turn on the lights when you go into a dark area. Replace any light bulbs as soon as they burn out. Set up your furniture so you have a clear path. Avoid moving your furniture around. If any of your floors are uneven, fix them. If there are any pets around you, be aware of where they are. Review your medicines with your doctor. Some medicines can make you feel dizzy. This can increase your chance of falling. Ask your doctor what other things that you can do to help prevent falls. This information is not intended to replace advice given to you by your health care provider. Make sure you discuss any questions you have with your health care provider. Document Released: 06/19/2009 Document Revised: 01/29/2016 Document Reviewed: 09/27/2014 Elsevier Interactive Patient Education  2017 [employer].

## 2023-02-07 ENCOUNTER — Encounter: Payer: Self-pay | Admitting: Family

## 2023-02-07 ENCOUNTER — Ambulatory Visit (INDEPENDENT_AMBULATORY_CARE_PROVIDER_SITE_OTHER): Payer: Medicare HMO | Admitting: Family

## 2023-02-07 VITALS — BP 120/78 | HR 82 | Temp 98.1°F | Resp 16 | Wt 189.4 lb

## 2023-02-07 DIAGNOSIS — E039 Hypothyroidism, unspecified: Secondary | ICD-10-CM

## 2023-02-07 NOTE — Progress Notes (Signed)
Patient is here for their 3 month follow-up Patient has no concerns today Care gaps have been discussed with patient  

## 2023-02-08 LAB — TSH: TSH: 1.72 u[IU]/mL (ref 0.450–4.500)

## 2023-02-14 ENCOUNTER — Other Ambulatory Visit: Payer: Self-pay | Admitting: Family

## 2023-02-14 NOTE — Telephone Encounter (Addendum)
Medication Refill - Medication:   furosemide (LASIX) 40 MG tablet [[id number]]   Has the patient contacted their pharmacy? No    Preferred Pharmacy (with phone number or street name):  [id number] - [place], [place] - [address]  [address] [place] [address]  Phone: [phone] Fax: [phone]  Hours: Not open 24 hours      Has the patient been seen for an appointment in the last year OR does the patient have an upcoming appointment? Yes.    Agent: Please be advised that RX refills may take up to 3 business days. We ask that you follow-up with your pharmacy.

## 2023-02-14 NOTE — Addendum Note (Signed)
Addended by: Nena Polio on: 02/14/2023 08:57 AM   Modules accepted: Orders

## 2023-02-15 NOTE — Telephone Encounter (Signed)
Requested medications are due for refill today.  unsure  Requested medications are on the active medications list.  yes  Last refill. 11/11/2017  Future visit scheduled.   yes  Notes to clinic.  Medication is historical.    Requested Prescriptions  Pending Prescriptions Disp Refills   furosemide (LASIX) 40 MG tablet 30 tablet     Sig: Take 1 tablet (40 mg total) by mouth daily.     Cardiovascular:  Diuretics - Loop Failed - 02/14/2023  8:57 AM      Failed - K in normal range and within 180 days    Potassium  Date Value Ref Range Status  03/12/2022 4.4 3.5 - 5.2 mmol/L Final         Failed - Ca in normal range and within 180 days    Calcium  Date Value Ref Range Status  03/12/2022 9.7 8.7 - 10.3 mg/dL Final         Failed - Na in normal range and within 180 days    Sodium  Date Value Ref Range Status  03/12/2022 142 134 - 144 mmol/L Final         Failed - Cr in normal range and within 180 days    Creatinine, Ser  Date Value Ref Range Status  03/12/2022 1.43 (H) 0.57 - 1.00 mg/dL Final         Failed - Cl in normal range and within 180 days    Chloride  Date Value Ref Range Status  03/12/2022 102 96 - 106 mmol/L Final         Failed - Mg Level in normal range and within 180 days    No results found for: "MG"       Passed - Last BP in normal range    BP Readings from Last 1 Encounters:  02/07/23 120/78         Passed - Valid encounter within last 6 months    Recent Outpatient Visits           1 week ago Hypothyroidism, unspecified type   [place] [facility] Amy J, NP   8 months ago Osteoporosis, unspecified osteoporosis type, unspecified pathological fracture presence   [place] [facility], [place], NP   1 year ago Encounter to establish care   [facility] at [facility], Salomon Fick, NP       Future Appointments             In 2 months Rema Fendt, NP [facility]  Primary Care at [facility]   In 1 year Pollyann Savoy, MD [facility] Rheumatology

## 2023-02-16 DIAGNOSIS — I10 Essential (primary) hypertension: Secondary | ICD-10-CM | POA: Diagnosis not present

## 2023-02-16 DIAGNOSIS — R600 Localized edema: Secondary | ICD-10-CM | POA: Diagnosis not present

## 2023-02-16 DIAGNOSIS — N1832 Chronic kidney disease, stage 3b: Secondary | ICD-10-CM | POA: Diagnosis not present

## 2023-02-16 DIAGNOSIS — N183 Chronic kidney disease, stage 3 unspecified: Secondary | ICD-10-CM | POA: Diagnosis not present

## 2023-02-16 DIAGNOSIS — E039 Hypothyroidism, unspecified: Secondary | ICD-10-CM | POA: Diagnosis not present

## 2023-02-16 DIAGNOSIS — G40909 Epilepsy, unspecified, not intractable, without status epilepticus: Secondary | ICD-10-CM | POA: Diagnosis not present

## 2023-02-16 DIAGNOSIS — K219 Gastro-esophageal reflux disease without esophagitis: Secondary | ICD-10-CM | POA: Diagnosis not present

## 2023-02-16 DIAGNOSIS — Z6833 Body mass index (BMI) 33.0-33.9, adult: Secondary | ICD-10-CM | POA: Diagnosis not present

## 2023-03-04 ENCOUNTER — Other Ambulatory Visit: Payer: Self-pay | Admitting: Family

## 2023-03-17 ENCOUNTER — Ambulatory Visit: Payer: Medicare HMO | Admitting: Neurology

## 2023-04-20 ENCOUNTER — Other Ambulatory Visit: Payer: Self-pay | Admitting: Family

## 2023-04-20 DIAGNOSIS — E039 Hypothyroidism, unspecified: Secondary | ICD-10-CM

## 2023-04-21 ENCOUNTER — Other Ambulatory Visit: Payer: Self-pay

## 2023-04-21 DIAGNOSIS — E039 Hypothyroidism, unspecified: Secondary | ICD-10-CM

## 2023-04-21 MED ORDER — LEVOTHYROXINE SODIUM 75 MCG PO TABS
75.0000 ug | ORAL_TABLET | Freq: Every day | ORAL | 0 refills | Status: DC
Start: 2023-04-21 — End: 2023-11-03

## 2023-05-10 ENCOUNTER — Encounter: Payer: Medicare HMO | Admitting: Family

## 2023-05-10 NOTE — Progress Notes (Signed)
Erroneous encounter-disregard

## 2023-05-18 DIAGNOSIS — M85852 Other specified disorders of bone density and structure, left thigh: Secondary | ICD-10-CM | POA: Diagnosis not present

## 2023-05-18 DIAGNOSIS — K219 Gastro-esophageal reflux disease without esophagitis: Secondary | ICD-10-CM | POA: Diagnosis not present

## 2023-05-18 DIAGNOSIS — D509 Iron deficiency anemia, unspecified: Secondary | ICD-10-CM | POA: Diagnosis not present

## 2023-05-18 DIAGNOSIS — I10 Essential (primary) hypertension: Secondary | ICD-10-CM | POA: Diagnosis not present

## 2023-05-18 DIAGNOSIS — E039 Hypothyroidism, unspecified: Secondary | ICD-10-CM | POA: Diagnosis not present

## 2023-05-18 DIAGNOSIS — Z0000 Encounter for general adult medical examination without abnormal findings: Secondary | ICD-10-CM | POA: Diagnosis not present

## 2023-05-18 DIAGNOSIS — E782 Mixed hyperlipidemia: Secondary | ICD-10-CM | POA: Diagnosis not present

## 2023-05-18 DIAGNOSIS — Z131 Encounter for screening for diabetes mellitus: Secondary | ICD-10-CM | POA: Diagnosis not present

## 2023-05-18 DIAGNOSIS — N2581 Secondary hyperparathyroidism of renal origin: Secondary | ICD-10-CM | POA: Diagnosis not present

## 2023-06-23 ENCOUNTER — Telehealth: Payer: Self-pay

## 2023-06-23 NOTE — Telephone Encounter (Signed)
Copied from CRM [id number]. Topic: General - Other >> Jun 23, 2023 10:42 AM Macon Large wrote: Reason for CRM: Pt reports that she has a new primary care provider and will no longer be coming in to the office.   Pt chart and PCP has been updated to reflect new PCP

## 2023-06-29 DIAGNOSIS — H5203 Hypermetropia, bilateral: Secondary | ICD-10-CM | POA: Diagnosis not present

## 2023-06-29 DIAGNOSIS — H52223 Regular astigmatism, bilateral: Secondary | ICD-10-CM | POA: Diagnosis not present

## 2023-06-29 DIAGNOSIS — H524 Presbyopia: Secondary | ICD-10-CM | POA: Diagnosis not present

## 2023-08-26 ENCOUNTER — Other Ambulatory Visit: Payer: Self-pay | Admitting: Neurology

## 2023-11-03 ENCOUNTER — Other Ambulatory Visit: Payer: Self-pay | Admitting: Family

## 2023-11-03 ENCOUNTER — Other Ambulatory Visit: Payer: Self-pay

## 2023-11-03 DIAGNOSIS — E039 Hypothyroidism, unspecified: Secondary | ICD-10-CM

## 2023-11-03 MED ORDER — LEVOTHYROXINE SODIUM 75 MCG PO TABS: 75.0000 ug | ORAL_TABLET | Freq: Every day | ORAL | 0 refills | Status: DC

## 2023-11-15 DIAGNOSIS — K219 Gastro-esophageal reflux disease without esophagitis: Secondary | ICD-10-CM | POA: Diagnosis not present

## 2023-11-15 DIAGNOSIS — N1832 Chronic kidney disease, stage 3b: Secondary | ICD-10-CM | POA: Diagnosis not present

## 2023-11-15 DIAGNOSIS — I10 Essential (primary) hypertension: Secondary | ICD-10-CM | POA: Diagnosis not present

## 2023-11-15 DIAGNOSIS — E782 Mixed hyperlipidemia: Secondary | ICD-10-CM | POA: Diagnosis not present

## 2023-11-15 DIAGNOSIS — R600 Localized edema: Secondary | ICD-10-CM | POA: Diagnosis not present

## 2023-11-15 DIAGNOSIS — E039 Hypothyroidism, unspecified: Secondary | ICD-10-CM | POA: Diagnosis not present

## 2023-12-13 ENCOUNTER — Ambulatory Visit: Payer: Medicare HMO | Admitting: Neurology

## 2023-12-19 ENCOUNTER — Other Ambulatory Visit: Payer: Self-pay | Admitting: Family

## 2024-01-15 ENCOUNTER — Other Ambulatory Visit: Payer: Self-pay | Admitting: Family

## 2024-01-16 NOTE — Telephone Encounter (Signed)
 Patient is seeing another provider disregard request.

## 2024-01-16 NOTE — Telephone Encounter (Signed)
 Schedule appointment. Last office visit 02/07/2023.

## 2024-01-23 DIAGNOSIS — K219 Gastro-esophageal reflux disease without esophagitis: Secondary | ICD-10-CM | POA: Diagnosis not present

## 2024-01-23 DIAGNOSIS — N2581 Secondary hyperparathyroidism of renal origin: Secondary | ICD-10-CM | POA: Diagnosis not present

## 2024-01-23 DIAGNOSIS — N189 Chronic kidney disease, unspecified: Secondary | ICD-10-CM | POA: Diagnosis not present

## 2024-01-23 DIAGNOSIS — N1832 Chronic kidney disease, stage 3b: Secondary | ICD-10-CM | POA: Diagnosis not present

## 2024-01-23 DIAGNOSIS — R609 Edema, unspecified: Secondary | ICD-10-CM | POA: Diagnosis not present

## 2024-01-23 DIAGNOSIS — I129 Hypertensive chronic kidney disease with stage 1 through stage 4 chronic kidney disease, or unspecified chronic kidney disease: Secondary | ICD-10-CM | POA: Diagnosis not present

## 2024-01-23 DIAGNOSIS — D631 Anemia in chronic kidney disease: Secondary | ICD-10-CM | POA: Diagnosis not present

## 2024-01-25 NOTE — Telephone Encounter (Signed)
 Noted

## 2024-02-17 DIAGNOSIS — I10 Essential (primary) hypertension: Secondary | ICD-10-CM | POA: Diagnosis not present

## 2024-02-17 DIAGNOSIS — N1832 Chronic kidney disease, stage 3b: Secondary | ICD-10-CM | POA: Diagnosis not present

## 2024-03-05 DIAGNOSIS — I10 Essential (primary) hypertension: Secondary | ICD-10-CM | POA: Diagnosis not present

## 2024-03-05 DIAGNOSIS — E669 Obesity, unspecified: Secondary | ICD-10-CM | POA: Diagnosis not present

## 2024-03-05 DIAGNOSIS — E782 Mixed hyperlipidemia: Secondary | ICD-10-CM | POA: Diagnosis not present

## 2024-03-05 DIAGNOSIS — E039 Hypothyroidism, unspecified: Secondary | ICD-10-CM | POA: Diagnosis not present

## 2024-03-17 DIAGNOSIS — I10 Essential (primary) hypertension: Secondary | ICD-10-CM | POA: Diagnosis not present

## 2024-03-17 DIAGNOSIS — N1832 Chronic kidney disease, stage 3b: Secondary | ICD-10-CM | POA: Diagnosis not present

## 2024-04-05 DIAGNOSIS — N1832 Chronic kidney disease, stage 3b: Secondary | ICD-10-CM | POA: Diagnosis not present

## 2024-04-05 DIAGNOSIS — I10 Essential (primary) hypertension: Secondary | ICD-10-CM | POA: Diagnosis not present

## 2024-04-05 DIAGNOSIS — E782 Mixed hyperlipidemia: Secondary | ICD-10-CM | POA: Diagnosis not present

## 2024-04-05 DIAGNOSIS — E039 Hypothyroidism, unspecified: Secondary | ICD-10-CM | POA: Diagnosis not present

## 2024-04-05 DIAGNOSIS — E669 Obesity, unspecified: Secondary | ICD-10-CM | POA: Diagnosis not present

## 2024-04-17 ENCOUNTER — Ambulatory Visit (HOSPITAL_BASED_OUTPATIENT_CLINIC_OR_DEPARTMENT_OTHER): Admitting: Student

## 2024-04-17 ENCOUNTER — Encounter (HOSPITAL_BASED_OUTPATIENT_CLINIC_OR_DEPARTMENT_OTHER): Payer: Self-pay | Admitting: Student

## 2024-04-17 VITALS — BP 130/73 | HR 78 | Temp 98.6°F | Resp 16 | Ht 61.22 in | Wt 162.2 lb

## 2024-04-17 DIAGNOSIS — Z131 Encounter for screening for diabetes mellitus: Secondary | ICD-10-CM

## 2024-04-17 DIAGNOSIS — K449 Diaphragmatic hernia without obstruction or gangrene: Secondary | ICD-10-CM | POA: Diagnosis not present

## 2024-04-17 DIAGNOSIS — N1832 Chronic kidney disease, stage 3b: Secondary | ICD-10-CM | POA: Diagnosis not present

## 2024-04-17 DIAGNOSIS — Z7689 Persons encountering health services in other specified circumstances: Secondary | ICD-10-CM | POA: Diagnosis not present

## 2024-04-17 DIAGNOSIS — E039 Hypothyroidism, unspecified: Secondary | ICD-10-CM

## 2024-04-17 DIAGNOSIS — I10 Essential (primary) hypertension: Secondary | ICD-10-CM | POA: Diagnosis not present

## 2024-04-17 DIAGNOSIS — E785 Hyperlipidemia, unspecified: Secondary | ICD-10-CM

## 2024-04-17 NOTE — Progress Notes (Signed)
 New Patient Office Visit  Subjective    Patient ID: Robin Horton, female    DOB: 12-11-1940  Age: 83 y.o. MRN: [id number]  CC:  Chief Complaint  Patient presents with   Establish Care    Here to establish care.    Discussed the use of AI scribe software for clinical note transcription with the patient, who gave verbal consent to proceed.  History of Present Illness   Robin Horton is an 83 year old female with hypertension and chronic kidney disease who presents for establishment of care.  She has hypertension, managed with amlodipine  5 mg daily, and reports her blood pressure is well-controlled. She maintains a diet focusing on salads, chicken, and fish to support blood pressure management.  Her chronic kidney disease is monitored regularly, and she last saw her nephrologist in May 2024. She is awaiting notification for her next appointment.  She has hyperlipidemia, managed with Lipitor 40 mg daily, and believes her cholesterol is well-controlled. There is a family history of high cholesterol, as her son also has elevated levels.  She takes famotidine for gastroesophageal reflux disease (GERD), which is well-controlled. She recalls a chest x-ray in 2021 that diagnosed a hiatal hernia.  She is on levothyroxine  75 mcg daily for hypothyroidism, taken in the morning before food.  She was previously on memantine  for memory issues following seizures in 2020, attributed to stress from family losses. She has not had seizures since mid-2020 and questions the necessity of continuing memantine .  She is active, attending a senior center for walking and memory exercises. She emphasizes maintaining her health through diet, primarily consuming salads, chicken, and fish, with occasional sweets. No shortness of breath.       Outpatient Encounter Medications as of 04/17/2024  Medication Sig   amLODipine  (NORVASC ) 5 MG tablet TAKE 1 TABLET EVERY DAY   atorvastatin  (LIPITOR) 40 MG tablet TAKE  1 TABLET EVERY DAY   Cholecalciferol 25 MCG (1000 UT) tablet Take 1,000 Units by mouth daily.   famotidine (PEPCID) 20 MG tablet Take 20 mg by mouth daily.   furosemide  (LASIX ) 20 MG tablet Take 20 mg by mouth daily.   levothyroxine  (SYNTHROID ) 75 MCG tablet Take 1 tablet (75 mcg total) by mouth daily before breakfast.   memantine  (NAMENDA ) 10 MG tablet TAKE 1 TABLET TWICE DAILY   [DISCONTINUED] furosemide  (LASIX ) 40 MG tablet Take 40 mg by mouth daily.   [DISCONTINUED] gabapentin  (NEURONTIN ) 100 MG capsule Take one cap in am and two caps QHS.   [DISCONTINUED] levETIRAcetam  (KEPPRA ) 500 MG tablet Take 1 tablet (500 mg total) by mouth 2 (two) times daily.   [DISCONTINUED] levothyroxine  (SYNTHROID ) 75 MCG tablet TAKE 1 TABLET EVERY DAY BEFORE BREAKFAST   [DISCONTINUED] levothyroxine  (SYNTHROID ) 75 MCG tablet TAKE 1 TABLET EVERY DAY BEFORE BREAKFAST   [DISCONTINUED] omeprazole  (PRILOSEC ) 40 MG capsule Take 1 capsule (40 mg total) by mouth daily.   No facility-administered encounter medications on file as of 04/17/2024.    Past Medical History:  Diagnosis Date   Anxiety    Chronic kidney disease    Dyslipidemia    GERD (gastroesophageal reflux disease)    History of bronchitis    History of hiatal hernia    History of palpitations    History of short term memory loss    Hypertension    Hypothyroidism    Iron deficiency anemia    OA (osteoarthritis) of knee    Left   Restless legs syndrome (RLS)  Seizures (HCC)    Myoclonic epilepsy   Urinary incontinence    Vitamin D deficiency     Past Surgical History:  Procedure Laterality Date   ABDOMINAL HYSTERECTOMY     BREAST BIOPSY     CATARACT EXTRACTION, BILATERAL Bilateral 2022   COLONOSCOPY     KNEE SURGERY     TONSILLECTOMY     TOTAL KNEE ARTHROPLASTY Right 11/28/2017   Procedure: RIGHT TOTAL KNEE ARTHROPLASTY;  Surgeon: Ernie Cough, MD;  Location: [facility];  Service: Orthopedics;  Laterality: Right;  Adductor Block   TOTAL  KNEE ARTHROPLASTY Left     Family History  Problem Relation Age of Onset   Colon cancer Mother    Stroke Mother        died at age 60   Diabetes Father        died at age 71   Ovarian cancer Daughter    Stroke Son    Heart attack Son    Healthy Son    Esophageal cancer Neg Hx    Rectal cancer Neg Hx    Stomach cancer Neg Hx     Social History   Socioeconomic History   Marital status: Widowed    Spouse name: Not on file   Number of children: 4   Years of education: 12   Highest education level: High school graduate  Occupational History   Occupation: Retired  Tobacco Use   Smoking status: Former    Passive exposure: Past   Smokeless tobacco: Never  Vaping Use   Vaping status: Never Used  Substance and Sexual Activity   Alcohol use: No   Drug use: No   Sexual activity: Not on file  Other Topics Concern   Not on file  Social History Narrative   2 children living   1 daughter passed in 2020   1 son had stroke and passed in 2018      Lives alone.   Right-handed.   2 cups caffeine per day.   Social Drivers of Corporate investment banker Strain: Low Risk  (02/03/2023)   Overall Financial Resource Strain (CARDIA)    Difficulty of Paying Living Expenses: Not hard at all  Food Insecurity: No Food Insecurity (04/17/2024)   Hunger Vital Sign    Worried About Running Out of Food in the Last Year: Never true    Ran Out of Food in the Last Year: Never true  Transportation Needs: No Transportation Needs (04/17/2024)   PRAPARE - Administrator, Civil Service (Medical): No    Lack of Transportation (Non-Medical): No  Physical Activity: Inactive (02/03/2023)   Exercise Vital Sign    Days of Exercise per Week: 0 days    Minutes of Exercise per Session: 0 min  Stress: No Stress Concern Present (02/03/2023)   [employer] of Occupational Health - Occupational Stress Questionnaire    Feeling of Stress : Not at all  Social Connections: Unknown (08/12/2022)    Received from [facility]   Social Network    Social Network: Not on file  Intimate Partner Violence: Not At Risk (04/17/2024)   Humiliation, Afraid, Rape, and Kick questionnaire    Fear of Current or Ex-Partner: No    Emotionally Abused: No    Physically Abused: No    Sexually Abused: No    ROS  Per HPI      Objective    BP 130/73   Pulse 78   Temp 98.6 F (37 C) (  Oral)   Resp 16   Ht 5' 1.22 (1.555 m)   Wt 162 lb 3.2 oz (73.6 kg)   PF 98 L/min   BMI 30.43 kg/m   Physical Exam Constitutional:      General: She is not in acute distress.    Appearance: Normal appearance. She is not ill-appearing.  HENT:     Head: Normocephalic and atraumatic.     Right Ear: External ear normal.     Left Ear: External ear normal.     Nose: Nose normal.     Mouth/Throat:     Mouth: Mucous membranes are moist.     Pharynx: Oropharynx is clear.  Eyes:     General: No scleral icterus.    Extraocular Movements: Extraocular movements intact.     Conjunctiva/sclera: Conjunctivae normal.  Neck:     Vascular: No carotid bruit.  Cardiovascular:     Rate and Rhythm: Normal rate and regular rhythm.     Pulses: Normal pulses.     Heart sounds: Normal heart sounds. No murmur heard.    No friction rub.  Pulmonary:     Effort: Pulmonary effort is normal. No respiratory distress.     Breath sounds: Normal breath sounds. No wheezing, rhonchi or rales.  Musculoskeletal:        General: Normal range of motion.     Cervical back: Neck supple.     Right lower leg: No edema.     Left lower leg: No edema.  Skin:    General: Skin is warm and dry.     Coloration: Skin is not jaundiced or pale.  Neurological:     General: No focal deficit present.     Mental Status: She is alert.     Deep Tendon Reflexes: Reflexes normal.  Psychiatric:        Mood and Affect: Mood normal.        Behavior: Behavior normal.     Last CBC Lab Results  Component Value Date   WBC 7.1 03/12/2022   HGB  13.7 03/12/2022   HCT 40.6 03/12/2022   MCV 90 03/12/2022   MCH 30.3 03/12/2022   RDW 13.1 03/12/2022   PLT 221 03/12/2022   Last metabolic panel Lab Results  Component Value Date   GLUCOSE 88 03/12/2022   NA 142 03/12/2022   K 4.4 03/12/2022   CL 102 03/12/2022   CO2 21 03/12/2022   BUN 21 03/12/2022   CREATININE 1.43 (H) 03/12/2022   EGFR 39.0 02/02/2023   CALCIUM  9.7 03/12/2022   PROT 6.7 03/12/2022   ALBUMIN 4.3 03/12/2022   LABGLOB 2.4 03/12/2022   AGRATIO 1.8 03/12/2022   BILITOT 0.5 03/12/2022   ALKPHOS 98 03/12/2022   AST 29 03/12/2022   ALT 18 03/12/2022   ANIONGAP 11 11/30/2017   Last lipids Lab Results  Component Value Date   CHOL 214 (H) 03/16/2022   HDL 89 03/16/2022   LDLCALC 100 (H) 03/16/2022   TRIG 146 03/16/2022   CHOLHDL 2.4 03/16/2022   Last hemoglobin A1c Lab Results  Component Value Date   HGBA1C 5.4 03/12/2022        Assessment & Plan:   Assessment and Plan    Chronic kidney disease stage 3 Chronic, stable.  Chronic kidney disease stage 3, well-managed. Regular nephrology follow-ups with Dr. Dennise. Last appointment in May 2024. - Continue follow-up with nephrologist, Dr. Dennise.  Hypertension Chronic, stable.  Hypertension, well-controlled on amlodipine  5 mg daily. - Continue amlodipine   5 mg daily.  Hyperlipidemia Chronic, stable.  Hyperlipidemia, well-controlled on Lipitor 40 mg daily. Familial history of hypercholesterolemia. - Continue Lipitor 40 mg daily. - Order baseline labs to check cholesterol levels.  Hypothyroidism Chronic, stable.  Hypothyroidism, well-managed on levothyroxine  75 mcg daily. - Continue levothyroxine  75 mcg daily.  Hiatal hernia with gastroesophageal reflux Chronic, stable.  Hiatal hernia with gastroesophageal reflux, controlled with famotidine. - Continue famotidine as needed for reflux symptoms.  Memory loss Memory loss managed with memantine . Reports no significant side effects and is unsure of  the benefit. - Continue memantine  if beneficial and no adverse effects are noted.  I will plan to take over this medication for this patient.  Seizure disorder Seizure disorder, no seizures since 2020. Previously managed with medication, discontinued. No current neurology follow-up.  - Monitor for any seizure activity.  I personally spent a total of 33 minutes in the care of the patient today including preparing to see the patient, getting/reviewing separately obtained history, performing a medically appropriate exam/evaluation, counseling and educating, placing orders, and documenting clinical information in the EHR.    Return in about 5 months (around 09/17/2024) for Chronic Followup.   Joshuwa Vecchio T Kenon Delashmit, PA-C

## 2024-04-17 NOTE — Patient Instructions (Signed)
 It was nice to see you today!  If you have any problems before your next visit feel free to message me via MyChart (minor issues or questions) or call the office, otherwise you may reach out to schedule an office visit.  Thank you! Pau Banh, PA-C

## 2024-04-18 ENCOUNTER — Ambulatory Visit (HOSPITAL_BASED_OUTPATIENT_CLINIC_OR_DEPARTMENT_OTHER): Payer: Self-pay | Admitting: Student

## 2024-04-18 LAB — CBC WITH DIFFERENTIAL/PLATELET
Basophils Absolute: 0.1 x10E3/uL (ref 0.0–0.2)
Basos: 1 %
EOS (ABSOLUTE): 0.2 x10E3/uL (ref 0.0–0.4)
Eos: 3 %
Hematocrit: 39.6 % (ref 34.0–46.6)
Hemoglobin: 12 g/dL (ref 11.1–15.9)
Immature Grans (Abs): 0 x10E3/uL (ref 0.0–0.1)
Immature Granulocytes: 0 %
Lymphocytes Absolute: 2.7 x10E3/uL (ref 0.7–3.1)
Lymphs: 39 %
MCH: 26.6 pg (ref 26.6–33.0)
MCHC: 30.3 g/dL — ABNORMAL LOW (ref 31.5–35.7)
MCV: 88 fL (ref 79–97)
Monocytes Absolute: 0.8 x10E3/uL (ref 0.1–0.9)
Monocytes: 11 %
Neutrophils Absolute: 3.2 x10E3/uL (ref 1.4–7.0)
Neutrophils: 46 %
Platelets: 281 x10E3/uL (ref 150–450)
RBC: 4.51 x10E6/uL (ref 3.77–5.28)
RDW: 14.5 % (ref 11.7–15.4)
WBC: 6.9 x10E3/uL (ref 3.4–10.8)

## 2024-04-18 LAB — LIPID PANEL
Chol/HDL Ratio: 1.9 ratio (ref 0.0–4.4)
Cholesterol, Total: 191 mg/dL (ref 100–199)
HDL: 98 mg/dL (ref >39)
LDL Chol Calc (NIH): 67 mg/dL (ref 0–99)
Triglycerides: 163 mg/dL — ABNORMAL HIGH (ref 0–149)
VLDL Cholesterol Cal: 26 mg/dL (ref 5–40)

## 2024-04-18 LAB — COMPREHENSIVE METABOLIC PANEL WITH GFR
ALT: 12 IU/L (ref 0–32)
AST: 20 IU/L (ref 0–40)
Albumin: 4.5 g/dL (ref 3.7–4.7)
Alkaline Phosphatase: 105 IU/L (ref 44–121)
BUN/Creatinine Ratio: 18 (ref 12–28)
BUN: 26 mg/dL (ref 8–27)
Bilirubin Total: 0.5 mg/dL (ref 0.0–1.2)
CO2: 20 mmol/L (ref 20–29)
Calcium: 9.6 mg/dL (ref 8.7–10.3)
Chloride: 104 mmol/L (ref 96–106)
Creatinine, Ser: 1.44 mg/dL — ABNORMAL HIGH (ref 0.57–1.00)
Globulin, Total: 2.6 g/dL (ref 1.5–4.5)
Glucose: 81 mg/dL (ref 70–99)
Potassium: 3.8 mmol/L (ref 3.5–5.2)
Sodium: 143 mmol/L (ref 134–144)
Total Protein: 7.1 g/dL (ref 6.0–8.5)
eGFR: 36 mL/min/1.73 — ABNORMAL LOW (ref >59)

## 2024-04-18 LAB — HEMOGLOBIN A1C
Est. average glucose Bld gHb Est-mCnc: 114 mg/dL
Hgb A1c MFr Bld: 5.6 % (ref 4.8–5.6)

## 2024-05-11 ENCOUNTER — Other Ambulatory Visit: Payer: Self-pay | Admitting: Family

## 2024-05-11 DIAGNOSIS — E039 Hypothyroidism, unspecified: Secondary | ICD-10-CM

## 2024-05-11 NOTE — Telephone Encounter (Signed)
 Schedule appointment. Last office visit 02/07/2023.

## 2024-05-14 NOTE — Telephone Encounter (Signed)
 Schedule appointment. Last office visit 02/07/2023.

## 2024-06-12 DIAGNOSIS — K219 Gastro-esophageal reflux disease without esophagitis: Secondary | ICD-10-CM | POA: Diagnosis not present

## 2024-06-12 DIAGNOSIS — Z79890 Hormone replacement therapy (postmenopausal): Secondary | ICD-10-CM | POA: Diagnosis not present

## 2024-06-12 DIAGNOSIS — M1990 Unspecified osteoarthritis, unspecified site: Secondary | ICD-10-CM | POA: Diagnosis not present

## 2024-06-12 DIAGNOSIS — E785 Hyperlipidemia, unspecified: Secondary | ICD-10-CM | POA: Diagnosis not present

## 2024-06-12 DIAGNOSIS — N2581 Secondary hyperparathyroidism of renal origin: Secondary | ICD-10-CM | POA: Diagnosis not present

## 2024-06-12 DIAGNOSIS — N1832 Chronic kidney disease, stage 3b: Secondary | ICD-10-CM | POA: Diagnosis not present

## 2024-06-12 DIAGNOSIS — I129 Hypertensive chronic kidney disease with stage 1 through stage 4 chronic kidney disease, or unspecified chronic kidney disease: Secondary | ICD-10-CM | POA: Diagnosis not present

## 2024-06-12 DIAGNOSIS — M810 Age-related osteoporosis without current pathological fracture: Secondary | ICD-10-CM | POA: Diagnosis not present

## 2024-06-12 DIAGNOSIS — E669 Obesity, unspecified: Secondary | ICD-10-CM | POA: Diagnosis not present

## 2024-06-12 DIAGNOSIS — E039 Hypothyroidism, unspecified: Secondary | ICD-10-CM | POA: Diagnosis not present

## 2024-06-12 DIAGNOSIS — R32 Unspecified urinary incontinence: Secondary | ICD-10-CM | POA: Diagnosis not present

## 2024-07-08 ENCOUNTER — Other Ambulatory Visit: Payer: Self-pay | Admitting: Family

## 2024-07-08 DIAGNOSIS — E039 Hypothyroidism, unspecified: Secondary | ICD-10-CM

## 2024-07-14 ENCOUNTER — Other Ambulatory Visit: Payer: Self-pay | Admitting: Student

## 2024-07-14 DIAGNOSIS — E039 Hypothyroidism, unspecified: Secondary | ICD-10-CM

## 2024-07-19 ENCOUNTER — Other Ambulatory Visit (HOSPITAL_BASED_OUTPATIENT_CLINIC_OR_DEPARTMENT_OTHER): Payer: Self-pay | Admitting: Student

## 2024-07-19 DIAGNOSIS — I10 Essential (primary) hypertension: Secondary | ICD-10-CM

## 2024-07-19 NOTE — Telephone Encounter (Unsigned)
 Copied from CRM [id number]. Topic: Clinical - Medication Refill >> Jul 19, 2024 10:58 AM Hadassah PARAS wrote: Medication: amLODipine  (NORVASC ) 5 MG tablet (pt is unsure if PCP would like for her to continue medication)   Has the patient contacted their pharmacy? Yes (Agent: If no, request that the patient contact the pharmacy for the refill. If patient does not wish to contact the pharmacy document the reason why and proceed with request.) (Agent: If yes, when and what did the pharmacy advise?)    [id number] - [place], [place] - [address] [facility]  [address] Phone: [phone] Fax: [phone]  Is this the correct pharmacy for this prescription? Yes If no, delete pharmacy and type the correct one.   Has the prescription been filled recently? No  Is the patient out of the medication? Yes  Has the patient been seen for an appointment in the last year OR does the patient have an upcoming appointment? Yes  Can we respond through MyChart? No  Agent: Please be advised that Rx refills may take up to 3 business days. We ask that you follow-up with your pharmacy.

## 2024-07-20 ENCOUNTER — Ambulatory Visit: Payer: Self-pay

## 2024-07-20 MED ORDER — AMLODIPINE BESYLATE 5 MG PO TABS
5.0000 mg | ORAL_TABLET | Freq: Every day | ORAL | 0 refills | Status: DC
Start: 2024-07-20 — End: 2024-07-24

## 2024-07-20 NOTE — Telephone Encounter (Signed)
 FYI Only or Action Required?: FYI only for provider: appointment scheduled on 07/24/24.  Patient was last seen in primary care on 04/17/2024 by Rothfuss, Lang DASEN, PA-C.  Called Nurse Triage reporting Hypertension.  Symptoms began today.  Interventions attempted: Prescription medications: Amlodipine .  Symptoms are: stable.  Triage Disposition: See PCP Within 2 Weeks  Patient/caregiver understands and will follow disposition?: Yes  Copied from CRM [id number]. Topic: General - Other >> Jul 20, 2024  4:01 PM Robin Horton wrote: Reason for CRM: Patient called in to return call to [employer] .. BP reading is 152/78 & no issues w/ BP or meds Reason for Disposition  [1] Systolic BP >= 130 OR Diastolic >= 80 AND [2] taking BP medications  Answer Assessment - Initial Assessment Questions Pt submitted refill request for amlodipine  on 11/12. 1x courtesy refill was submitted to pts pharmacy today with instructions to f/u in office for future refills. CMA contact pt and left VM yesterday asking pt to call back and update office on what her BP readings and any possible issues with her medication. Pt calling to report her BP 152/78 after running errands, 145/72 at rest, HR 67. No symptoms. Has not missed any doses of amlodipine . Scheduled appt with PCP within 2 weeks on 11/18 to discuss BP meds. Advised UC or ED for worsening symptoms.   1. BLOOD PRESSURE: What is your blood pressure? Did you take at least two measurements 5 minutes apart?     152/78, 145/72, HR 67  2. ONSET: When did you take your blood pressure?     Today  3. HOW: How did you take your blood pressure? (e.g., automatic home BP monitor, visiting nurse)     Arm cuff  4. HISTORY: Do you have a history of high blood pressure?     Yes  5. MEDICINES: Are you taking any medicines for blood pressure? Have you missed any doses recently?     Amlodipine  5 mg, has been taking for a long time. Has not missed any does.  6. OTHER  SYMPTOMS: Do you have any symptoms? (e.g., blurred vision, chest pain, difficulty breathing, headache, weakness)     Denies  Protocols used: Blood Pressure - High-A-AH

## 2024-07-20 NOTE — Telephone Encounter (Signed)
 Left detailed msg informing pt PCP wanted her to continue amlodipine  per last OV dated 04/17/2024. I refilled it since pt was out but I left VM asking pt to call back with BP readings or to let us  know if she has had any issues with BP or her med.

## 2024-07-24 ENCOUNTER — Ambulatory Visit (INDEPENDENT_AMBULATORY_CARE_PROVIDER_SITE_OTHER): Admitting: Student

## 2024-07-24 ENCOUNTER — Encounter (HOSPITAL_BASED_OUTPATIENT_CLINIC_OR_DEPARTMENT_OTHER): Payer: Self-pay | Admitting: Student

## 2024-07-24 VITALS — BP 122/73 | HR 79 | Temp 98.6°F | Resp 16 | Ht 61.22 in | Wt 166.0 lb

## 2024-07-24 DIAGNOSIS — K047 Periapical abscess without sinus: Secondary | ICD-10-CM | POA: Diagnosis not present

## 2024-07-24 DIAGNOSIS — I10 Essential (primary) hypertension: Secondary | ICD-10-CM

## 2024-07-24 DIAGNOSIS — N1832 Chronic kidney disease, stage 3b: Secondary | ICD-10-CM | POA: Insufficient documentation

## 2024-07-24 MED ORDER — AMLODIPINE BESYLATE 5 MG PO TABS
5.0000 mg | ORAL_TABLET | Freq: Every day | ORAL | 1 refills | Status: AC
Start: 2024-07-24 — End: ?

## 2024-07-24 MED ORDER — AMLODIPINE BESYLATE 5 MG PO TABS: 5.0000 mg | ORAL_TABLET | Freq: Every day | ORAL | 1 refills | Status: DC

## 2024-07-24 MED ORDER — AMOXICILLIN-POT CLAVULANATE 500-125 MG PO TABS: 1.0000 | ORAL_TABLET | Freq: Two times a day (BID) | ORAL | 0 refills | Status: AC

## 2024-07-24 NOTE — Addendum Note (Signed)
 Addended by: Hinata Diener on: 07/24/2024 02:34 PM   Modules accepted: Level of Service

## 2024-07-24 NOTE — Progress Notes (Signed)
 Acute Office Visit  Subjective:     Patient ID: Robin Horton, female    DOB: [birth year], 83 y.o.   MRN: [id number]  Chief Complaint  Patient presents with   Hypertension    Recent BP reading is 152/78 & no issues w/ BP or meds.  She missed two days of the amlodipine . Picked it up Saturday.   Discussed the use of AI scribe software for clinical note transcription with the patient, who gave verbal consent to proceed.  History of Present Illness   Robin Horton is an 83 year old female with hypertension who presents for blood pressure management and dental pain.  Her blood pressure has been fluctuating, which she attributes to her home blood pressure machine. She experienced a brief period without her blood pressure medication, amlodipine , due to a prescription issue, but has since resumed taking it once daily. No changes in her health since her last visit.  She has dental pain associated with a tooth on the lower jaw, causing discomfort when chewing. She has a dentist appointment scheduled for next month. She visits the dentist twice a year and is unsure of the dentist's office name but knows the location.  Her current medications include amlodipine  once daily and atorvastatin  (Lipitor), which she has an adequate supply of. She denies using ibuprofen, Aleve, or Goody powders. She is under the care of Dr. Dennise at [facility] for her kidney condition.  Socially, she plans to spend Thanksgiving with her son and Christmas with friends and family at her son-in-law's house. She has eight or nine great-grandchildren, with the youngest being 55 old.      ROS Per HPI     Objective:    BP 122/73   Pulse 79   Temp 98.6 F (37 C) (Oral)   Resp 16   Ht 5' 1.22 (1.555 m)   Wt 166 lb (75.3 kg)   SpO2 97%   BMI 31.14 kg/m  BP Readings from Last 3 Encounters:  07/24/24 122/73  04/17/24 130/73  02/07/23 120/78   Wt Readings from Last 3 Encounters:  07/24/24 166 lb  (75.3 kg)  04/17/24 162 lb 3.2 oz (73.6 kg)  02/07/23 189 lb 7.2 oz (85.9 kg)   SpO2 Readings from Last 3 Encounters:  07/24/24 97%  02/07/23 94%  06/10/22 98%      Physical Exam Constitutional:      General: She is not in acute distress.    Appearance: Normal appearance. She is not ill-appearing.  HENT:     Head: Normocephalic and atraumatic.     Nose: Nose normal.     Mouth/Throat:     Mouth: Mucous membranes are moist.     Pharynx: Oropharynx is clear.     Comments: Pain to palpation of lateral gums beside R Lower molar Eyes:     General: No scleral icterus.    Conjunctiva/sclera: Conjunctivae normal.  Cardiovascular:     Rate and Rhythm: Normal rate and regular rhythm.     Heart sounds: Normal heart sounds. No murmur heard.    No friction rub.  Pulmonary:     Effort: Pulmonary effort is normal. No respiratory distress.     Breath sounds: Normal breath sounds. No wheezing, rhonchi or rales.  Musculoskeletal:        General: Normal range of motion.  Lymphadenopathy:     Cervical: Cervical adenopathy present.  Skin:    General: Skin is warm and dry.     Coloration: Skin  is not jaundiced or pale.  Neurological:     General: No focal deficit present.     Mental Status: She is alert.  Psychiatric:        Mood and Affect: Mood normal.        Behavior: Behavior normal.     No results found for any visits on 07/24/24.      Assessment & Plan:   Assessment and Plan    Dental infection (periapical abscess) Pain localized to the last tooth on the right side, with difficulty chewing. Possible periapical abscess indicated by gum swelling and lymphadenopathy, suggesting infection. No significant pain upon tapping the tooth, but discomfort around the gum area. - Prescribed antibiotic for dental infection - Advised to move up dental appointment to address potential infection - Instructed to contact if unable to secure an earlier dental appointment  Essential  hypertension Blood pressure is well-controlled with current medication regimen. Recent fluctuations may be due to home blood pressure monitor accuracy. - Refilled amlodipine  prescription - Plan to check home blood pressure monitor accuracy at next visit in January  Chronic kidney disease, stage 3b Chronic kidney disease, stage 3b. No use of nephrotoxic medications such as NSAIDs. - Continue current management and avoid nephrotoxic medications      Return if symptoms worsen or fail to improve.  Kaylon Hitz T Ayrianna Mcginniss, PA-C

## 2024-07-24 NOTE — Patient Instructions (Signed)
 It was nice to see you today!  As we discussed in clinic:  - Please make sure that you follow up in the next couple of weeks with your dentist.  If you have any problems before your next visit feel free to message me via MyChart (minor issues or questions) or call the office, otherwise you may reach out to schedule an office visit.  Thank you! Zhaniya Swallows, PA-C

## 2024-08-31 ENCOUNTER — Ambulatory Visit (HOSPITAL_BASED_OUTPATIENT_CLINIC_OR_DEPARTMENT_OTHER): Admitting: Family Medicine

## 2024-08-31 ENCOUNTER — Encounter (HOSPITAL_BASED_OUTPATIENT_CLINIC_OR_DEPARTMENT_OTHER): Payer: Self-pay

## 2024-08-31 ENCOUNTER — Ambulatory Visit: Payer: Self-pay

## 2024-08-31 ENCOUNTER — Ambulatory Visit (HOSPITAL_BASED_OUTPATIENT_CLINIC_OR_DEPARTMENT_OTHER)
Admission: EM | Admit: 2024-08-31 | Discharge: 2024-08-31 | Disposition: A | Discharge status: Home / Self Care | Attending: Family Medicine | Admitting: Family Medicine

## 2024-08-31 DIAGNOSIS — J069 Acute upper respiratory infection, unspecified: Secondary | ICD-10-CM

## 2024-08-31 DIAGNOSIS — R053 Chronic cough: Secondary | ICD-10-CM

## 2024-08-31 MED ORDER — TRIAMCINOLONE ACETONIDE 40 MG/ML IJ SUSP
40.0000 mg | Freq: Once | INTRAMUSCULAR | Status: AC
  Administered 2024-08-31: 40 mg via INTRAMUSCULAR

## 2024-08-31 MED ORDER — PROMETHAZINE-DM 6.25-15 MG/5ML PO SYRP: 5.0000 mL | ORAL_SOLUTION | Freq: Four times a day (QID) | ORAL | 0 refills | Status: DC | PRN

## 2024-08-31 NOTE — Telephone Encounter (Signed)
 FYI Only or Action Required?: FYI only for provider: appointment scheduled on 08/31/24.  Patient was last seen in primary care on 07/24/2024 by Rothfuss, Lang DASEN, PA-C.  Called Nurse Triage reporting Cough.  Symptoms began several days ago.  Interventions attempted: Nothing.  Symptoms are: unchanged.  Triage Disposition: See Physician Within 24 Hours  Patient/caregiver understands and will follow disposition?: Yes Reason for Disposition  [1] Continuous (nonstop) coughing interferes with work or school AND [2] no improvement using cough treatment per Care Advice  Answer Assessment - Initial Assessment Questions Has not taken anything OTC.   1. ONSET: When did the cough begin?      4 days ago  2. SEVERITY: How bad is the cough today?      Getting worse  3. SPUTUM: Describe the color of your sputum (e.g., none, dry cough; clear, white, yellow, green)     Chest congestion but having a hard time getting it out  4. DIFFICULTY BREATHING: Are you having difficulty breathing? If Yes, ask: How bad is it? (e.g., mild, moderate, severe)      Denies gasping for air, but is experiencing some difficulty breathing because has to cough often  5. FEVER: Do you have a fever? If Yes, ask: What is your temperature, how was it measured, and when did it start?     A little over 98  6. CARDIAC HISTORY: Do you have any history of heart disease? (e.g., heart attack, congestive heart failure)      Denies  7. LUNG HISTORY: Do you have any history of lung disease?  (e.g., pulmonary embolus, asthma, emphysema)     Denies  8. OTHER SYMPTOMS: Do you have any other symptoms? (e.g., runny nose, wheezing, chest pain)       Runny nose  Protocols used: Cough - Acute Non-Productive-A-AH  Copied from CRM [id number]. Topic: Clinical - Red Word Triage >> Aug 31, 2024  8:07 AM Amy B wrote: Red Word that prompted transfer to Nurse Triage: Dry cough, shortness of breath

## 2024-08-31 NOTE — ED Provider Notes (Addendum)
 " [facility]    CSN: [id number] Arrival date & time: 08/31/24  1555      History   Chief Complaint Chief Complaint  Patient presents with   Cough    HPI Robin Horton is a 83 y.o. female.   83 year old female with report of cough, nasal congestion, shortness of breath, wheezing, chills and fatigue since approximately 08/18/2024 or earlier.  She has been using OTC cough drops with poor relief.  She is not aware of being around anyone who had flu or COVID.   Cough Associated symptoms: chills, rhinorrhea, shortness of breath and wheezing   Associated symptoms: no chest pain, no ear pain, no fever, no rash and no sore throat     Past Medical History:  Diagnosis Date   Anxiety    Chronic kidney disease    Dyslipidemia    GERD (gastroesophageal reflux disease)    History of bronchitis    History of hiatal hernia    History of palpitations    History of short term memory loss    Hypertension    Hypothyroidism    Iron deficiency anemia    OA (osteoarthritis) of knee    Left   Restless legs syndrome (RLS)    Seizures (HCC)    Myoclonic epilepsy   Urinary incontinence    Vitamin D deficiency     Patient Active Problem List   Diagnosis Date Noted   Stage 3b chronic kidney disease (HCC) 07/24/2024   Other specified disorders of bone density and structure, left thigh 03/12/2022   Gastroesophageal reflux disease 03/03/2022   Hiatal hernia 03/03/2022   Screening for colorectal cancer 03/03/2022   Gait abnormality 01/01/2020   Memory loss 01/01/2020   Partial symptomatic epilepsy with complex partial seizures, not intractable, without status epilepticus (HCC) 02/22/2018   Obese 11/30/2017   S/P right TKA 11/28/2017   RLS (restless legs syndrome) 08/25/2016   Benign essential hypertension 10/20/2015   Dyslipidemia 10/20/2015   Heartburn 10/20/2015   Hypothyroidism 10/20/2015   Memory loss, short term 10/20/2015   Palpitations 10/20/2015   Primary  osteoarthritis of left knee 10/20/2015   Vitamin D deficiency 10/20/2015    Past Surgical History:  Procedure Laterality Date   ABDOMINAL HYSTERECTOMY     BREAST BIOPSY     CATARACT EXTRACTION, BILATERAL Bilateral 2022   COLONOSCOPY     KNEE SURGERY     TONSILLECTOMY     TOTAL KNEE ARTHROPLASTY Right 11/28/2017   Procedure: RIGHT TOTAL KNEE ARTHROPLASTY;  Surgeon: Ernie Cough, MD;  Location: [facility];  Service: Orthopedics;  Laterality: Right;  Adductor Block   TOTAL KNEE ARTHROPLASTY Left     OB History   No obstetric history on file.      Home Medications    Prior to Admission medications  Medication Sig Start Date End Date Taking? Authorizing Provider  promethazine -dextromethorphan (PROMETHAZINE -DM) 6.25-15 MG/5ML syrup Take 5 mLs by mouth 4 (four) times daily as needed for cough. Do not use and drive - May make drowsy. 08/31/24  Yes Ival Domino, FNP  amLODipine  (NORVASC ) 5 MG tablet Take 1 tablet (5 mg total) by mouth daily. 07/24/24   Rothfuss, Jacob T, PA-C  atorvastatin  (LIPITOR) 40 MG tablet TAKE 1 TABLET EVERY DAY 03/04/23   Jaycee Greig PARAS, NP  Cholecalciferol 25 MCG (1000 UT) tablet Take 1,000 Units by mouth daily.    [provider]  famotidine (PEPCID) 20 MG tablet Take 20 mg by mouth daily.  [provider]  furosemide  (LASIX ) 20 MG tablet Take 20 mg by mouth daily.    [provider]  levothyroxine  (SYNTHROID ) 75 MCG tablet TAKE 1 TABLET BY MOUTH DAILY BEFORE BREAKFAST. 07/16/24   Rothfuss, Jacob T, PA-C  memantine  (NAMENDA ) 10 MG tablet TAKE 1 TABLET TWICE DAILY 08/29/23   Gayland Lauraine PARAS, NP    Family History Family History  Problem Relation Age of Onset   Colon cancer Mother    Stroke Mother        died at age 57   Diabetes Father        died at age 94   Ovarian cancer Daughter    Stroke Son    Heart attack Son    Healthy Son    Esophageal cancer Neg Hx    Rectal cancer Neg Hx    Stomach cancer Neg Hx     Social  History Social History[1]   Allergies   Lamictal  [lamotrigine ], Hydrochlorothiazide, and Sulfamethoxazole   Review of Systems Review of Systems  Constitutional:  Positive for chills and fatigue. Negative for fever.  HENT:  Positive for congestion, postnasal drip and rhinorrhea. Negative for ear pain, sinus pressure, sinus pain and sore throat.   Eyes:  Negative for pain and visual disturbance.  Respiratory:  Positive for cough, chest tightness, shortness of breath and wheezing.   Cardiovascular:  Negative for chest pain and palpitations.  Gastrointestinal:  Negative for abdominal pain, constipation, diarrhea, nausea and vomiting.  Genitourinary:  Negative for dysuria and hematuria.  Musculoskeletal:  Negative for arthralgias and back pain.  Skin:  Negative for color change and rash.  Neurological:  Negative for seizures and syncope.  All other systems reviewed and are negative.    Physical Exam Triage Vital Signs ED Triage Vitals  Encounter Vitals Group     BP 08/31/24 1657 (!) 161/69     Girls Systolic BP Percentile --      Girls Diastolic BP Percentile --      Boys Systolic BP Percentile --      Boys Diastolic BP Percentile --      Pulse Rate 08/31/24 1657 99     Resp 08/31/24 1657 16     Temp 08/31/24 1657 99.9 F (37.7 C)     Temp Source 08/31/24 1657 Oral     SpO2 08/31/24 1657 94 %     Weight --      Height --      Head Circumference --      Peak Flow --      Pain Score 08/31/24 1656 0     Pain Loc --      Pain Education --      Exclude from Growth Chart --    No data found.  Updated Vital Signs BP (!) 161/69 (BP Location: Right Arm)   Pulse 99   Temp 99.9 F (37.7 C) (Oral)   Resp 16   SpO2 94%   Visual Acuity Right Eye Distance:   Left Eye Distance:   Bilateral Distance:    Right Eye Near:   Left Eye Near:    Bilateral Near:     Physical Exam Vitals and nursing note reviewed.  Constitutional:      General: She is not in acute distress.     Appearance: She is well-developed. She is ill-appearing. She is not toxic-appearing or diaphoretic.  HENT:     Head: Normocephalic and atraumatic.     Right Ear: Hearing, tympanic  membrane, ear canal and external ear normal.     Left Ear: Hearing, tympanic membrane, ear canal and external ear normal.     Nose: Congestion and rhinorrhea present. Rhinorrhea is clear.     Right Sinus: No maxillary sinus tenderness or frontal sinus tenderness.     Left Sinus: No maxillary sinus tenderness or frontal sinus tenderness.     Mouth/Throat:     Lips: Pink.     Mouth: Mucous membranes are moist.     Pharynx: Uvula midline. No oropharyngeal exudate or posterior oropharyngeal erythema.     Tonsils: No tonsillar exudate.  Eyes:     Conjunctiva/sclera: Conjunctivae normal.     Pupils: Pupils are equal, round, and reactive to light.  Cardiovascular:     Rate and Rhythm: Normal rate and regular rhythm.     Heart sounds: S1 normal and S2 normal. No murmur heard. Pulmonary:     Effort: Pulmonary effort is normal. No respiratory distress.     Breath sounds: Examination of the right-upper field reveals wheezing. Examination of the left-upper field reveals wheezing. Wheezing (Rare inspiratory wheeze.) present. No decreased breath sounds, rhonchi or rales.  Abdominal:     General: Bowel sounds are normal.     Palpations: Abdomen is soft.     Tenderness: There is no abdominal tenderness.  Musculoskeletal:        General: No swelling.     Cervical back: Neck supple.  Lymphadenopathy:     Head:     Right side of head: No submental, submandibular, tonsillar, preauricular or posterior auricular adenopathy.     Left side of head: No submental, submandibular, tonsillar, preauricular or posterior auricular adenopathy.     Cervical: No cervical adenopathy.     Right cervical: No superficial cervical adenopathy.    Left cervical: No superficial cervical adenopathy.  Skin:    General: Skin is warm and dry.      Capillary Refill: Capillary refill takes less than 2 seconds.     Findings: No rash.  Neurological:     Mental Status: She is alert and oriented to person, place, and time.  Psychiatric:        Mood and Affect: Mood normal.      UC Treatments / Results  Labs (all labs ordered are listed, but only abnormal results are displayed) Labs Reviewed - No data to display  EKG   Radiology No results found.  Procedures Procedures (including critical care time)  Medications Ordered in UC Medications  triamcinolone  acetonide (KENALOG -40) injection 40 mg (40 mg Intramuscular Given 08/31/24 1848)    Initial Impression / Assessment and Plan / UC Course  I have reviewed the triage vital signs and the nursing notes.  Pertinent labs & imaging results that were available during my care of the patient were reviewed by me and considered in my medical decision making (see chart for details).  Plan of Care (see discharge instructions for additional patient precautions and education): Viral upper respiratory infection versus early bronchitis with chronic cough: Kenalog  40 mg injection now.  Promethazine  DM, 5 mL, every 6 hours if needed for cough.  Get plenty of fluids and rest.  Chest x-ray deferred for now as her breath sounds are clear and audible throughout with a rare wheeze.    If her cough persists or her symptoms worsen, she should return for a chest x-ray.   Follow-up with primary care if symptoms do not improve, if symptoms worsen or if new symptoms occur.  See below for signs and symptoms of worsening condition and reasons to go to an emergency room.  I reviewed the plan of care with the patient and/or the patient's guardian.  The patient and/or guardian had time to ask questions and acknowledged that the questions were answered.  Final Clinical Impressions(s) / UC Diagnoses   Final diagnoses:  Viral URI  Chronic cough     Discharge Instructions      Viral upper respiratory  infection versus early bronchitis with chronic cough: Kenalog  40 mg injection now.  Promethazine  DM, 5 mL, every 6 hours if needed for cough.  Get plenty of fluids and rest.  Follow-up with primary care if symptoms do not improve, if symptoms worsen or if new symptoms occur.  See below for signs and symptoms of worsening condition and reasons to go to an emergency room.  Get help right away if: You have shortness of breath that gets worse. You have severe or persistent: Headache. Ear pain. Sinus pain. Chest pain. You have chronic lung disease along with any of the following: Making high-pitched whistling sounds when you breathe, most often when you breathe out (wheezing). Prolonged cough (more than 14 days). Coughing up blood. A change in your usual mucus. You have a stiff neck. You have changes in your: Vision. Hearing. Thinking. Mood. These symptoms may be an emergency. Get help right away. Call 911. Do not wait to see if the symptoms will go away. Do not drive yourself to the hospital.     ED Prescriptions     Medication Sig Dispense Auth. Provider   promethazine -dextromethorphan (PROMETHAZINE -DM) 6.25-15 MG/5ML syrup Take 5 mLs by mouth 4 (four) times daily as needed for cough. Do not use and drive - May make drowsy. 118 mL Ival Domino, FNP      PDMP not reviewed this encounter.    Ival Domino, FNP 08/31/24 1849     [1]  Social History Tobacco Use   Smoking status: Former    Passive exposure: Past   Smokeless tobacco: Never  Vaping Use   Vaping status: Never Used  Substance Use Topics   Alcohol use: No   Drug use: No     Ival Domino, FNP 08/31/24 1906  "

## 2024-08-31 NOTE — ED Triage Notes (Signed)
 Patient here today with c/o cough, nasal congestion, wheeze, SOB, chills, and fatigue X 1.5 weeks. She has been using cough drops with no relief. No known sick contacts.

## 2024-08-31 NOTE — Discharge Instructions (Addendum)
 Viral upper respiratory infection versus early bronchitis with chronic cough: Kenalog  40 mg injection now.  Promethazine  DM, 5 mL, every 6 hours if needed for cough.  Get plenty of fluids and rest.  Follow-up with primary care if symptoms do not improve, if symptoms worsen or if new symptoms occur.  See below for signs and symptoms of worsening condition and reasons to go to an emergency room.  Get help right away if: You have shortness of breath that gets worse. You have severe or persistent: Headache. Ear pain. Sinus pain. Chest pain. You have chronic lung disease along with any of the following: Making high-pitched whistling sounds when you breathe, most often when you breathe out (wheezing). Prolonged cough (more than 14 days). Coughing up blood. A change in your usual mucus. You have a stiff neck. You have changes in your: Vision. Hearing. Thinking. Mood. These symptoms may be an emergency. Get help right away. Call 911. Do not wait to see if the symptoms will go away. Do not drive yourself to the hospital.

## 2024-09-17 ENCOUNTER — Encounter (HOSPITAL_BASED_OUTPATIENT_CLINIC_OR_DEPARTMENT_OTHER): Payer: Self-pay | Admitting: Student

## 2024-09-17 ENCOUNTER — Ambulatory Visit (INDEPENDENT_AMBULATORY_CARE_PROVIDER_SITE_OTHER): Admitting: Student

## 2024-09-17 VITALS — BP 127/73 | HR 86 | Temp 98.2°F | Resp 16 | Ht 61.22 in | Wt 160.1 lb

## 2024-09-17 DIAGNOSIS — E785 Hyperlipidemia, unspecified: Secondary | ICD-10-CM | POA: Diagnosis not present

## 2024-09-17 DIAGNOSIS — K219 Gastro-esophageal reflux disease without esophagitis: Secondary | ICD-10-CM | POA: Diagnosis not present

## 2024-09-17 DIAGNOSIS — G40209 Localization-related (focal) (partial) symptomatic epilepsy and epileptic syndromes with complex partial seizures, not intractable, without status epilepticus: Secondary | ICD-10-CM | POA: Diagnosis not present

## 2024-09-17 DIAGNOSIS — I10 Essential (primary) hypertension: Secondary | ICD-10-CM

## 2024-09-17 DIAGNOSIS — E559 Vitamin D deficiency, unspecified: Secondary | ICD-10-CM

## 2024-09-17 DIAGNOSIS — E039 Hypothyroidism, unspecified: Secondary | ICD-10-CM | POA: Diagnosis not present

## 2024-09-17 DIAGNOSIS — N1832 Chronic kidney disease, stage 3b: Secondary | ICD-10-CM | POA: Diagnosis not present

## 2024-09-17 NOTE — Patient Instructions (Signed)
 It was nice to see you today!  As we discussed in clinic: - Make sure to call your nephrologist (Dr. Dennise) to set up your next appointment.  If you have any problems before your next visit feel free to message me via MyChart (minor issues or questions) or call the office, otherwise you may reach out to schedule an office visit.  Thank you! Pepper Kerrick, PA-C

## 2024-09-17 NOTE — Progress Notes (Signed)
 "  Established Patient Office Visit  Subjective   Patient ID: Robin Horton, female    DOB: 03-06-1941  Age: 84 y.o. MRN: [id number]  Chief Complaint  Patient presents with   Medical Management of Chronic Issues    Follow up.    HPI  Discussed the use of AI scribe software for clinical note transcription with the patient, who gave verbal consent to proceed.  History of Present Illness   Robin Horton is an 84 year old female with chronic kidney disease and hypertension who presents for a routine follow-up visit.  She experienced a respiratory illness in late December, which was treated with promethazine  DM, leading to resolution of her symptoms. She describes the illness as difficult to overcome but ultimately successful with the medication.  She has chronic kidney disease and has not seen her nephrologist since May 2024. She typically waits for the nephrologist's office to call her for appointments.  Her blood pressure has been variable at home, but she has not been checking it daily. She takes amlodipine  for hypertension and notes occasional swelling in her legs.  She has a history of seizures, with the last episode occurring in August 2020, attributed to stress from personal losses. She has not had any seizures since then and is not on any seizure medications.  She takes famotidine once or twice daily for reflux and reports no issues with the medication. She also takes a thyroid  medication in the morning on an empty stomach and reports no problems.  She has a family history of hypercholesterolemia and takes medication for cholesterol management without experiencing muscle aches or joint pains.  She has a history of vitamin D  deficiency and has been on vitamin D  supplementation for several years.      Patient Active Problem List   Diagnosis Date Noted   Stage 3b chronic kidney disease (HCC) 07/24/2024   Other specified disorders of bone density and structure, left thigh  03/12/2022   Gastroesophageal reflux disease 03/03/2022   Hiatal hernia 03/03/2022   Screening for colorectal cancer 03/03/2022   Gait abnormality 01/01/2020   Memory loss 01/01/2020   Partial symptomatic epilepsy with complex partial seizures, not intractable, without status epilepticus (HCC) 02/22/2018   Obese 11/30/2017   S/P right TKA 11/28/2017   RLS (restless legs syndrome) 08/25/2016   Benign essential hypertension 10/20/2015   Dyslipidemia 10/20/2015   Heartburn 10/20/2015   Hypothyroidism 10/20/2015   Memory loss, short term 10/20/2015   Palpitations 10/20/2015   Primary osteoarthritis of left knee 10/20/2015   Vitamin D  deficiency 10/20/2015   Past Medical History:  Diagnosis Date   Anxiety    Chronic kidney disease    Dyslipidemia    GERD (gastroesophageal reflux disease)    History of bronchitis    History of hiatal hernia    History of palpitations    History of short term memory loss    Hypertension    Hypothyroidism    Iron deficiency anemia    OA (osteoarthritis) of knee    Left   Restless legs syndrome (RLS)    Seizures (HCC)    Myoclonic epilepsy   Urinary incontinence    Vitamin D  deficiency    Social History[1] Allergies[2]    ROS Per HPI.    Objective:     BP 127/73 (BP Location: Left Arm, Patient Position: Sitting, Cuff Size: Normal)   Pulse 86   Temp 98.2 F (36.8 C) (Oral)   Resp 16   Ht  5' 1.22 (1.555 m)   Wt 160 lb 1.6 oz (72.6 kg)   SpO2 97%   BMI 30.03 kg/m  BP Readings from Last 3 Encounters:  09/17/24 127/73  08/31/24 (!) 161/69  07/24/24 122/73   Wt Readings from Last 3 Encounters:  09/17/24 160 lb 1.6 oz (72.6 kg)  07/24/24 166 lb (75.3 kg)  04/17/24 162 lb 3.2 oz (73.6 kg)   SpO2 Readings from Last 3 Encounters:  09/17/24 97%  08/31/24 94%  07/24/24 97%      Physical Exam Constitutional:      General: She is not in acute distress.    Appearance: Normal appearance. She is not ill-appearing.  HENT:      Head: Normocephalic and atraumatic.     Nose: Nose normal.  Eyes:     General: No scleral icterus.    Conjunctiva/sclera: Conjunctivae normal.  Cardiovascular:     Rate and Rhythm: Normal rate and regular rhythm.     Heart sounds: Normal heart sounds. No murmur heard.    No friction rub.  Pulmonary:     Effort: Pulmonary effort is normal. No respiratory distress.     Breath sounds: Normal breath sounds. No wheezing, rhonchi or rales.  Musculoskeletal:        General: Normal range of motion.  Skin:    General: Skin is warm and dry.     Coloration: Skin is not jaundiced or pale.  Neurological:     General: No focal deficit present.     Mental Status: She is alert.  Psychiatric:        Mood and Affect: Mood normal.        Behavior: Behavior normal.      No results found for any visits on 09/17/24.  Last CBC Lab Results  Component Value Date   WBC 6.9 04/17/2024   HGB 12.0 04/17/2024   HCT 39.6 04/17/2024   MCV 88 04/17/2024   MCH 26.6 04/17/2024   RDW 14.5 04/17/2024   PLT 281 04/17/2024   Last metabolic panel Lab Results  Component Value Date   GLUCOSE 81 04/17/2024   NA 143 04/17/2024   K 3.8 04/17/2024   CL 104 04/17/2024   CO2 20 04/17/2024   BUN 26 04/17/2024   CREATININE 1.44 (H) 04/17/2024   EGFR 36 (L) 04/17/2024   CALCIUM  9.6 04/17/2024   PROT 7.1 04/17/2024   ALBUMIN 4.5 04/17/2024   LABGLOB 2.6 04/17/2024   AGRATIO 1.8 03/12/2022   BILITOT 0.5 04/17/2024   ALKPHOS 105 04/17/2024   AST 20 04/17/2024   ALT 12 04/17/2024   ANIONGAP 11 11/30/2017   Last lipids Lab Results  Component Value Date   CHOL 191 04/17/2024   HDL 98 04/17/2024   LDLCALC 67 04/17/2024   TRIG 163 (H) 04/17/2024   CHOLHDL 1.9 04/17/2024   Last hemoglobin A1c Lab Results  Component Value Date   HGBA1C 5.6 04/17/2024      The ASCVD Risk score (Arnett DK, et al., 2019) failed to calculate for the following reasons:   The 2019 ASCVD risk score is only valid for ages  56 to 68   * - Cholesterol units were assumed    Assessment & Plan:   Assessment and Plan    Stage 3b chronic kidney disease No recent follow-up with nephrologist since May 2024. - Instructed patient to contact nephrologist for follow-up appointment. - Abstain from nsaids and unecessary contrast - CMP for gfr  Benign essential hypertension Chronic, stable.  Blood pressure well-controlled at 127/73 mmHg. Occasional leg swelling likely due to amlodipine . - Continue current antihypertensive regimen. - CBC, CMP  Gastroesophageal reflux disease Chronic, stable. Managed with famotidine, taken once or twice daily as needed. - Continue famotidine as needed. - CBC, CMP  Acquired hypothyroidism Chronic, stable. Thyroid  function well-managed with current medication regimen. - Continue current thyroid  medication regimen. - Reassess thyroid   Hyperlipidemia Managed with current medication. No muscle aches or joint pains reported. - Continue current lipid-lowering therapy. - Reassess lipid  Vitamin D  deficiency Managed with supplementation. - Ordered vitamin D  level to assess current status.  Partial symptomatic epilepsy with complex partial seizures, not intractable No seizures since August 2020. No current medication for seizure prevention. - continue to monitor - No longer following with neuro since she is seizure free off medication for now      Orders Placed This Encounter  Procedures   CBC with Differential/Platelet   Comprehensive metabolic panel with GFR   Lipid panel   TSH + free T4   VITAMIN D  25 Hydroxy (Vit-D Deficiency, Fractures)    Return in about 6 months (around 03/17/2025) for Chronic Followup.    Eli Pattillo T Alka Falwell, PA-C    [1]  Social History Tobacco Use   Smoking status: Former    Passive exposure: Past   Smokeless tobacco: Never  Vaping Use   Vaping status: Never Used  Substance Use Topics   Alcohol use: No   Drug use: No  [2]   Allergies Allergen Reactions   Lamictal  [Lamotrigine ] Rash   Hydrochlorothiazide Other (See Comments)    Other reaction(s): Other Renal insufficiency Renal insufficiency    Sulfamethoxazole Rash   "

## 2024-09-18 ENCOUNTER — Ambulatory Visit (HOSPITAL_BASED_OUTPATIENT_CLINIC_OR_DEPARTMENT_OTHER): Payer: Self-pay | Admitting: Student

## 2024-09-18 LAB — COMPREHENSIVE METABOLIC PANEL WITH GFR
ALT: 12 IU/L (ref 0–32)
AST: 22 IU/L (ref 0–40)
Albumin: 4.3 g/dL (ref 3.7–4.7)
Alkaline Phosphatase: 93 IU/L (ref 48–129)
BUN/Creatinine Ratio: 19 (ref 12–28)
BUN: 27 mg/dL (ref 8–27)
Bilirubin Total: 0.5 mg/dL (ref 0.0–1.2)
CO2: 24 mmol/L (ref 20–29)
Calcium: 9.6 mg/dL (ref 8.7–10.3)
Chloride: 102 mmol/L (ref 96–106)
Creatinine, Ser: 1.39 mg/dL — ABNORMAL HIGH (ref 0.57–1.00)
Globulin, Total: 2.4 g/dL (ref 1.5–4.5)
Glucose: 108 mg/dL — ABNORMAL HIGH (ref 70–99)
Potassium: 4 mmol/L (ref 3.5–5.2)
Sodium: 142 mmol/L (ref 134–144)
Total Protein: 6.7 g/dL (ref 6.0–8.5)
eGFR: 38 mL/min/1.73 — ABNORMAL LOW

## 2024-09-18 LAB — TSH+FREE T4
Free T4: 1.42 ng/dL (ref 0.82–1.77)
TSH: 1.95 u[IU]/mL (ref 0.450–4.500)

## 2024-09-18 LAB — CBC WITH DIFFERENTIAL/PLATELET
Basophils Absolute: 0.1 x10E3/uL (ref 0.0–0.2)
Basos: 1 %
EOS (ABSOLUTE): 0.1 x10E3/uL (ref 0.0–0.4)
Eos: 2 %
Hematocrit: 36.9 % (ref 34.0–46.6)
Hemoglobin: 11.8 g/dL (ref 11.1–15.9)
Immature Grans (Abs): 0 x10E3/uL (ref 0.0–0.1)
Immature Granulocytes: 0 %
Lymphocytes Absolute: 2.7 x10E3/uL (ref 0.7–3.1)
Lymphs: 35 %
MCH: 27.2 pg (ref 26.6–33.0)
MCHC: 32 g/dL (ref 31.5–35.7)
MCV: 85 fL (ref 79–97)
Monocytes Absolute: 0.6 x10E3/uL (ref 0.1–0.9)
Monocytes: 8 %
Neutrophils Absolute: 4.2 x10E3/uL (ref 1.4–7.0)
Neutrophils: 54 %
Platelets: 345 x10E3/uL (ref 150–450)
RBC: 4.34 x10E6/uL (ref 3.77–5.28)
RDW: 14.3 % (ref 11.7–15.4)
WBC: 7.8 x10E3/uL (ref 3.4–10.8)

## 2024-09-18 LAB — LIPID PANEL
Chol/HDL Ratio: 1.6 ratio (ref 0.0–4.4)
Cholesterol, Total: 188 mg/dL (ref 100–199)
HDL: 118 mg/dL
LDL Chol Calc (NIH): 58 mg/dL (ref 0–99)
Triglycerides: 67 mg/dL (ref 0–149)
VLDL Cholesterol Cal: 12 mg/dL (ref 5–40)

## 2024-09-18 LAB — VITAMIN D 25 HYDROXY (VIT D DEFICIENCY, FRACTURES): Vit D, 25-Hydroxy: 53.2 ng/mL (ref 30.0–100.0)

## 2024-09-19 ENCOUNTER — Telehealth (HOSPITAL_BASED_OUTPATIENT_CLINIC_OR_DEPARTMENT_OTHER): Payer: Self-pay | Admitting: Student

## 2024-09-19 NOTE — Telephone Encounter (Signed)
 Copied from CRM [id number]. Topic: Clinical - Lab/Test Results >> Sep 19, 2024  2:49 PM Robin Horton wrote: Reason for CRM: Patient returning call regarding lab results. Seen msg from provider and read it to the patient. She verbalized understanding. She states she has not called her kidney provider to schedule and appt yet but she will contact them today.

## 2024-10-02 ENCOUNTER — Other Ambulatory Visit: Payer: Self-pay | Admitting: Neurology

## 2024-10-02 NOTE — Telephone Encounter (Signed)
 Last seen on 08/17/22  Rx denied, needs updated visit if we are going to fill medication, was told to return in 6 months.

## 2024-12-20 ENCOUNTER — Ambulatory Visit: Payer: Medicare HMO | Admitting: Rheumatology

## 2025-03-18 ENCOUNTER — Ambulatory Visit (HOSPITAL_BASED_OUTPATIENT_CLINIC_OR_DEPARTMENT_OTHER): Admitting: Student
